# Patient Record
Sex: Female | Born: 1990 | Race: White | Hispanic: No | Marital: Married | Smoking: Never smoker
Health system: Southern US, Community
[De-identification: ages and names within clinical notes are randomized; demographics above are authoritative.]

## PROBLEM LIST (undated history)

## (undated) ENCOUNTER — Inpatient Hospital Stay: Payer: Self-pay

## (undated) DIAGNOSIS — Z789 Other specified health status: Secondary | ICD-10-CM

## (undated) DIAGNOSIS — E119 Type 2 diabetes mellitus without complications: Secondary | ICD-10-CM

## (undated) DIAGNOSIS — O039 Complete or unspecified spontaneous abortion without complication: Secondary | ICD-10-CM

## (undated) HISTORY — PX: NO PAST SURGERIES: SHX2092

## (undated) HISTORY — DX: Complete or unspecified spontaneous abortion without complication: O03.9

---

## 2016-08-01 ENCOUNTER — Ambulatory Visit (INDEPENDENT_AMBULATORY_CARE_PROVIDER_SITE_OTHER): Payer: BLUE CROSS/BLUE SHIELD | Admitting: Obstetrics & Gynecology

## 2016-08-01 ENCOUNTER — Encounter: Payer: Self-pay | Admitting: Obstetrics & Gynecology

## 2016-08-01 VITALS — BP 120/80 | HR 83 | Wt 189.0 lb

## 2016-08-01 DIAGNOSIS — Z3490 Encounter for supervision of normal pregnancy, unspecified, unspecified trimester: Secondary | ICD-10-CM | POA: Insufficient documentation

## 2016-08-01 DIAGNOSIS — Z3A09 9 weeks gestation of pregnancy: Secondary | ICD-10-CM

## 2016-08-01 LAB — POCT URINE PREGNANCY: PREG TEST UR: POSITIVE — AB

## 2016-08-01 NOTE — Addendum Note (Signed)
Addended by: Cornelius MorasPATTERSON, Rehmat Murtagh D on: 08/01/2016 02:30 PM   Modules accepted: Orders

## 2016-08-01 NOTE — Patient Instructions (Signed)
First Trimester of Pregnancy The first trimester of pregnancy is from week 1 until the end of week 13 (months 1 through 3). A week after a sperm fertilizes an egg, the egg will implant on the wall of the uterus. This embryo will begin to develop into a baby. Genes from you and your partner will form the baby. The female genes will determine whether the baby will be a boy or a girl. At 6-8 weeks, the eyes and face will be formed, and the heartbeat can be seen on ultrasound. At the end of 12 weeks, all the baby's organs will be formed. Now that you are pregnant, you will want to do everything you can to have a healthy baby. Two of the most important things are to get good prenatal care and to follow your health care provider's instructions. Prenatal care is all the medical care you receive before the baby's birth. This care will help prevent, find, and treat any problems during the pregnancy and childbirth. Body changes during your first trimester Your body goes through many changes during pregnancy. The changes vary from woman to woman.  You may gain or lose a couple of pounds at first.  You may feel sick to your stomach (nauseous) and you may throw up (vomit). If the vomiting is uncontrollable, call your health care provider.  You may tire easily.  You may develop headaches that can be relieved by medicines. All medicines should be approved by your health care provider.  You may urinate more often. Painful urination may mean you have a bladder infection.  You may develop heartburn as a result of your pregnancy.  You may develop constipation because certain hormones are causing the muscles that push stool through your intestines to slow down.  You may develop hemorrhoids or swollen veins (varicose veins).  Your breasts may begin to grow larger and become tender. Your nipples may stick out more, and the tissue that surrounds them (areola) may become darker.  Your gums may bleed and may be  sensitive to brushing and flossing.  Dark spots or blotches (chloasma, mask of pregnancy) may develop on your face. This will likely fade after the baby is born.  Your menstrual periods will stop.  You may have a loss of appetite.  You may develop cravings for certain kinds of food.  You may have changes in your emotions from day to day, such as being excited to be pregnant or being concerned that something may go wrong with the pregnancy and baby.  You may have more vivid and strange dreams.  You may have changes in your hair. These can include thickening of your hair, rapid growth, and changes in texture. Some women also have hair loss during or after pregnancy, or hair that feels dry or thin. Your hair will most likely return to normal after your baby is born. What to expect at prenatal visits During a routine prenatal visit:  You will be weighed to make sure you and the baby are growing normally.  Your blood pressure will be taken.  Your abdomen will be measured to track your baby's growth.  The fetal heartbeat will be listened to between weeks 10 and 14 of your pregnancy.  Test results from any previous visits will be discussed. Your health care provider may ask you:  How you are feeling.  If you are feeling the baby move.  If you have had any abnormal symptoms, such as leaking fluid, bleeding, severe headaches, or abdominal   cramping.  If you are using any tobacco products, including cigarettes, chewing tobacco, and electronic cigarettes.  If you have any questions. Other tests that may be performed during your first trimester include:  Blood tests to find your blood type and to check for the presence of any previous infections. The tests will also be used to check for low iron levels (anemia) and protein on red blood cells (Rh antibodies). Depending on your risk factors, or if you previously had diabetes during pregnancy, you may have tests to check for high blood sugar  that affects pregnant women (gestational diabetes).  Urine tests to check for infections, diabetes, or protein in the urine.  An ultrasound to confirm the proper growth and development of the baby.  Fetal screens for spinal cord problems (spina bifida) and Down syndrome.  HIV (human immunodeficiency virus) testing. Routine prenatal testing includes screening for HIV, unless you choose not to have this test.  You may need other tests to make sure you and the baby are doing well. Follow these instructions at home: Medicines   Follow your health care provider's instructions regarding medicine use. Specific medicines may be either safe or unsafe to take during pregnancy.  Take a prenatal vitamin that contains at least 600 micrograms (mcg) of folic acid.  If you develop constipation, try taking a stool softener if your health care provider approves. Eating and drinking   Eat a balanced diet that includes fresh fruits and vegetables, whole grains, good sources of protein such as meat, eggs, or tofu, and low-fat dairy. Your health care provider will help you determine the amount of weight gain that is right for you.  Avoid raw meat and uncooked cheese. These carry germs that can cause birth defects in the baby.  Eating four or five small meals rather than three large meals a day may help relieve nausea and vomiting. If you start to feel nauseous, eating a few soda crackers can be helpful. Drinking liquids between meals, instead of during meals, also seems to help ease nausea and vomiting.  Limit foods that are high in fat and processed sugars, such as fried and sweet foods.  To prevent constipation:  Eat foods that are high in fiber, such as fresh fruits and vegetables, whole grains, and beans.  Drink enough fluid to keep your urine clear or pale yellow. Activity   Exercise only as directed by your health care provider. Most women can continue their usual exercise routine during  pregnancy. Try to exercise for 30 minutes at least 5 days a week. Exercising will help you:  Control your weight.  Stay in shape.  Be prepared for labor and delivery.  Experiencing pain or cramping in the lower abdomen or lower back is a good sign that you should stop exercising. Check with your health care provider before continuing with normal exercises.  Try to avoid standing for long periods of time. Move your legs often if you must stand in one place for a long time.  Avoid heavy lifting.  Wear low-heeled shoes and practice good posture.  You may continue to have sex unless your health care provider tells you not to. Relieving pain and discomfort   Wear a good support bra to relieve breast tenderness.  Take warm sitz baths to soothe any pain or discomfort caused by hemorrhoids. Use hemorrhoid cream if your health care provider approves.  Rest with your legs elevated if you have leg cramps or low back pain.  If you develop   varicose veins in your legs, wear support hose. Elevate your feet for 15 minutes, 3-4 times a day. Limit salt in your diet. Prenatal care   Schedule your prenatal visits by the twelfth week of pregnancy. They are usually scheduled monthly at first, then more often in the last 2 months before delivery.  Write down your questions. Take them to your prenatal visits.  Keep all your prenatal visits as told by your health care provider. This is important. Safety   Wear your seat belt at all times when driving.  Make a list of emergency phone numbers, including numbers for family, friends, the hospital, and police and fire departments. General instructions   Ask your health care provider for a referral to a local prenatal education class. Begin classes no later than the beginning of month 6 of your pregnancy.  Ask for help if you have counseling or nutritional needs during pregnancy. Your health care provider can offer advice or refer you to specialists for  help with various needs.  Do not use hot tubs, steam rooms, or saunas.  Do not douche or use tampons or scented sanitary pads.  Do not cross your legs for long periods of time.  Avoid cat litter boxes and soil used by cats. These carry germs that can cause birth defects in the baby and possibly loss of the fetus by miscarriage or stillbirth.  Avoid all smoking, herbs, alcohol, and medicines not prescribed by your health care provider. Chemicals in these products affect the formation and growth of the baby.  Do not use any products that contain nicotine or tobacco, such as cigarettes and e-cigarettes. If you need help quitting, ask your health care provider. You may receive counseling support and other resources to help you quit.  Schedule a dentist appointment. At home, brush your teeth with a soft toothbrush and be gentle when you floss. Contact a health care provider if:  You have dizziness.  You have mild pelvic cramps, pelvic pressure, or nagging pain in the abdominal area.  You have persistent nausea, vomiting, or diarrhea.  You have a bad smelling vaginal discharge.  You have pain when you urinate.  You notice increased swelling in your face, hands, legs, or ankles.  You are exposed to fifth disease or chickenpox.  You are exposed to German measles (rubella) and have never had it. Get help right away if:  You have a fever.  You are leaking fluid from your vagina.  You have spotting or bleeding from your vagina.  You have severe abdominal cramping or pain.  You have rapid weight gain or loss.  You vomit blood or material that looks like coffee grounds.  You develop a severe headache.  You have shortness of breath.  You have any kind of trauma, such as from a fall or a car accident. Summary  The first trimester of pregnancy is from week 1 until the end of week 13 (months 1 through 3).  Your body goes through many changes during pregnancy. The changes vary  from woman to woman.  You will have routine prenatal visits. During those visits, your health care provider will examine you, discuss any test results you may have, and talk with you about how you are feeling. This information is not intended to replace advice given to you by your health care provider. Make sure you discuss any questions you have with your health care provider. Document Released: 04/26/2001 Document Revised: 04/13/2016 Document Reviewed: 04/13/2016 Elsevier Interactive Patient Education    2017 [employer]  Commonly Asked Questions During Pregnancy  Cats: A parasite can be excreted in cat feces.  To avoid exposure you need to have another person empty the little box.  If you must empty the litter box you will need to wear gloves.  Wash your hands after handling your cat.  This parasite can also be found in raw or undercooked meat so this should also be avoided.  Colds, Sore Throats, Flu: Please check your medication sheet to see what you can take for symptoms.  If your symptoms are unrelieved by these medications please call the office.  Dental Work: Most any dental work Agricultural consultantyour dentist recommends is permitted.  X-rays should only be taken during the first trimester if absolutely necessary.  Your abdomen should be shielded with a lead apron during all x-rays.  Please notify your provider prior to receiving any x-rays.  Novocaine is fine; gas is not recommended.  If your dentist requires a note from us prior to dental work please call the office and we will provide one for you.  Exercise: Exercise is an important part of staying healthy during your pregnancy.  You may continue most exercises you were accustomed to prior to pregnancy.  Later in your pregnancy you will most likely notice you have difficulty with activities requiring balance like riding a bicycle.  It is important that you listen to your body and avoid activities that put you at a higher risk of falling.  Adequate rest and  staying well hydrated are a must!  If you have questions about the safety of specific activities ask your provider.    Exposure to Children with illness: Try to avoid obvious exposure; report any symptoms to us when noted,  If you have chicken pos, red measles or mumps, you should be immune to these diseases.   Please do not take any vaccines while pregnant unless you have checked with your OB provider.  Fetal Movement: After 28 weeks we recommend you do "kick counts" twice daily.  Lie or sit down in a calm quiet environment and count your baby movements "kicks".  You should feel your baby at least 10 times per hour.  If you have not felt 10 kicks within the first hour get up, walk around and have something sweet to eat or drink then repeat for an additional hour.  If count remains less than 10 per hour notify your provider.  Fumigating: Follow your pest control agent's advice as to how long to stay out of your home.  Ventilate the area well before re-entering.  Hemorrhoids:   Most over-the-counter preparations can be used during pregnancy.  Check your medication to see what is safe to use.  It is important to use a stool softener or fiber in your diet and to drink lots of liquids.  If hemorrhoids seem to be getting worse please call the office.   Hot Tubs:  Hot tubs Jacuzzis and saunas are not recommended while pregnant.  These increase your internal body temperature and should be avoided.  Intercourse:  Sexual intercourse is safe during pregnancy as long as you are comfortable, unless otherwise advised by your provider.  Spotting may occur after intercourse; report any bright red bleeding that is heavier than spotting.  Labor:  If you know that you are in labor, please go to the hospital.  If you are unsure, please call the office and let us help you decide what to do.  Lifting, straining, etc:  If  requires heavy lifting or straining please check with your provider for any limitations.   Generally, you should not lift items heavier than that you can lift simply with your hands and arms (no back muscles)  Painting:  Paint fumes do not harm your pregnancy, but may make you ill and should be avoided if possible.  Latex or water based paints have less odor than oils.  Use adequate ventilation while painting.  Permanents & Hair Color:  Chemicals in hair dyes are not recommended as they cause increase hair dryness which can increase hair loss during pregnancy.  " Highlighting" and permanents are allowed.  Dye may be absorbed differently and permanents may not hold as well during pregnancy.  Sunbathing:  Use a sunscreen, as skin burns easily during pregnancy.  Drink plenty of fluids; avoid over heating.  Tanning Beds:  Because their possible side effects are still unknown, tanning beds are not recommended.  Ultrasound Scans:  Routine ultrasounds are performed at approximately 20 weeks.  You will be able to see your baby's general anatomy an if you would like to know the gender this can usually be determined as well.  If it is questionable when you conceived you may also receive an ultrasound early in your pregnancy for dating purposes.  Otherwise ultrasound exams are not routinely performed unless there is a medical necessity.  Although you can request a scan we ask that you pay for it when conducted because insurance does not cover " patient request" scans.  Work: If your pregnancy proceeds without complications you may work until your due date, unless your physician or employer advises otherwise.  Round Ligament Pain/Pelvic Discomfort:  Sharp, shooting pains not associated with bleeding are fairly common, usually occurring in the second trimester of pregnancy.  They tend to be worse when standing up or when you remain standing for long periods of time.  These are the result of pressure of certain pelvic ligaments called "round ligaments".  Rest, Tylenol and heat seem to be the most  effective relief.  As the womb and fetus grow, they rise out of the pelvis and the discomfort improves.  Please notify the office if your pain seems different than that described.  It may represent a more serious condition.   

## 2016-08-01 NOTE — Progress Notes (Addendum)
08/01/2016   Chief Complaint: Missed period  History of Present Illness: Ms. Whiteaker is a 26 y.o. G1P0 at 9 weeks based on Patient's last menstrual period was 05/30/2016. with an Estimated Date of Delivery: 03/06/17, with the above CC. Preg complicated by nothing.  Her periods were: regular periods every 28 days She was using no method when she conceived.  She has Positive signs or symptoms of nausea/vomiting of pregnancy. She has Negative signs or symptoms of miscarriage or preterm labor She identifies Negative Zika risk factors for her and her partner On any different medications around the time she conceived/early pregnancy: No  History of varicella: Yes   ROS: A 12-point review of systems was performed and negative, except as stated in the above HPI.  OBGYN History: As per HPI. OB History  Gravida Para Term Preterm AB Living  1            SAB TAB Ectopic Multiple Live Births               # Outcome Date GA Lbr Len/2nd Weight Sex Delivery Anes PTL Lv  1 Current               Any issues with any prior pregnancies: not applicable Any prior children are healthy, doing well, without any problems or issues: not applicable History of pap smears: Yes. Last pap smear one year. Abnormal: no  History of STIs: No   Past Medical History: No past medical history on file.  Past Surgical History: No past surgical history on file.  Family History:  No family history on file. She denies any female cancers, bleeding or blood clotting disorders.  She denies any history of mental retardation, birth defects or genetic disorders in her or the FOB's history  Social History:  Social History   Social History  . Marital status: Married    Spouse name: N/A  . Number of children: N/A  . Years of education: N/A   Occupational History  . Not on file.   Social History Main Topics  . Smoking status: Not on file  . Smokeless tobacco: Not on file  . Alcohol use Not on file  . Drug use:  Unknown  . Sexual activity: Not on file   Other Topics Concern  . Not on file   Social History Narrative  . No narrative on file   Any pets in the household: no  Allergy: Not on File  Current Outpatient Medications: No current outpatient prescriptions on file.   Physical Exam:   BP 120/80   Pulse 83   Wt 189 lb (85.7 kg)   LMP 05/30/2016  BMI 33. Fundal height: not applicable FHTs: too early  Constitutional: Well nourished, well developed female in no acute distress.  Neck:  Supple, normal appearance, and no thyromegaly  Cardiovascular: S1, S2 normal, no murmur, rub or gallop, regular rate and rhythm Respiratory:  Clear to auscultation bilateral. Normal respiratory effort Abdomen: positive bowel sounds and no masses, hernias; diffusely non tender to palpation, non distended Breasts: breasts appear normal, no suspicious masses, no skin or nipple changes or axillary nodes. Neuro/Psych:  Normal mood and affect.  Skin:  Warm and dry.  Lymphatic:  No inguinal lymphadenopathy.   Pelvic exam: is not limited by body habitus EGBUS: within normal limits, Vagina: within normal limits and with no blood in the vault, Cervix: normal appearing cervix without discharge or lesions, closed/long/high, Uterus:  enlarged: 8 weeks, and Adnexa:  not evaluated  Assessment: Ms. Rennis Hardingllis is a 26 y.o. G1P0 9 weeks based on Patient's last menstrual period was 05/30/2016. with an Estimated Date of Delivery: 03/06/17 noted.,  for prenatal care.  Plan:  PNC discussed.  The patient has not traveled to a [place] endemic area within the past 6 months, nor has she had unprotected sex with a partner who has travelled to a [place] endemic region within the past 6 months. The patient has been advised to notify us if these factors change any time during this current pregnancy, so adequate testing and monitoring can be initiated.  Problem list reviewed and updated.  Follow up in 2 weeks. Annamarie MajorPaul Nardos Putnam, MD,  Merlinda FrederickFACOG [facility], [facility] 08/01/2016  1:44 PM

## 2016-08-03 LAB — RPR+RH+ABO+RUB AB+AB SCR+CB...
ANTIBODY SCREEN: NEGATIVE
HIV Screen 4th Generation wRfx: NONREACTIVE
Hematocrit: 40.5 % (ref 34.0–46.6)
Hemoglobin: 13.6 g/dL (ref 11.1–15.9)
Hepatitis B Surface Ag: NEGATIVE
MCH: 31.3 pg (ref 26.6–33.0)
MCHC: 33.6 g/dL (ref 31.5–35.7)
MCV: 93 fL (ref 79–97)
PLATELETS: 368 10*3/uL (ref 150–379)
RBC: 4.35 x10E6/uL (ref 3.77–5.28)
RDW: 12.9 % (ref 12.3–15.4)
RPR Ser Ql: NONREACTIVE
Rh Factor: POSITIVE
Rubella Antibodies, IGG: 5.08 index (ref >0.99)
VARICELLA: 241 {index} (ref >165)
WBC: 14.3 10*3/uL — AB (ref 3.4–10.8)

## 2016-08-03 LAB — URINE CULTURE: Organism ID, Bacteria: NO GROWTH

## 2016-08-04 LAB — IGP,CTNGTV,RFX APTIMA HPV ASCU
Chlamydia, Nuc. Acid Amp: NEGATIVE
Gonococcus, Nuc. Acid Amp: NEGATIVE
PAP Smear Comment: 0
Trich vag by NAA: NEGATIVE

## 2016-08-25 ENCOUNTER — Ambulatory Visit (INDEPENDENT_AMBULATORY_CARE_PROVIDER_SITE_OTHER): Payer: BLUE CROSS/BLUE SHIELD | Admitting: Obstetrics and Gynecology

## 2016-08-25 ENCOUNTER — Ambulatory Visit (INDEPENDENT_AMBULATORY_CARE_PROVIDER_SITE_OTHER): Payer: BLUE CROSS/BLUE SHIELD

## 2016-08-25 VITALS — BP 120/80 | Wt 189.0 lb

## 2016-08-25 DIAGNOSIS — Z3A09 9 weeks gestation of pregnancy: Secondary | ICD-10-CM | POA: Diagnosis not present

## 2016-08-25 DIAGNOSIS — Z3687 Encounter for antenatal screening for uncertain dates: Secondary | ICD-10-CM

## 2016-08-25 DIAGNOSIS — Z3490 Encounter for supervision of normal pregnancy, unspecified, unspecified trimester: Secondary | ICD-10-CM | POA: Diagnosis not present

## 2016-08-25 DIAGNOSIS — Z3A12 12 weeks gestation of pregnancy: Secondary | ICD-10-CM

## 2016-08-25 NOTE — Progress Notes (Signed)
Dating scan confirms EDD. Pt declines 1st tri scrn. Improved nausea. No VB, LOF. Pos PNVs. RTO in 4 wks.

## 2016-08-30 NOTE — Progress Notes (Unsigned)
Review of ULTRASOUND.    I have personally reviewed images and report of recent ultrasound done at [facility].    Plan of management to be discussed with patient.  Annamarie Major, MD, [facility], [facility] 08/30/2016  10:18 AM

## 2016-09-23 ENCOUNTER — Ambulatory Visit (INDEPENDENT_AMBULATORY_CARE_PROVIDER_SITE_OTHER): Payer: BLUE CROSS/BLUE SHIELD | Admitting: Certified Nurse Midwife

## 2016-09-23 VITALS — BP 120/80 | Ht 63.0 in | Wt 193.0 lb

## 2016-09-23 DIAGNOSIS — Z131 Encounter for screening for diabetes mellitus: Secondary | ICD-10-CM

## 2016-09-23 DIAGNOSIS — Z3A16 16 weeks gestation of pregnancy: Secondary | ICD-10-CM

## 2016-09-23 DIAGNOSIS — Z3490 Encounter for supervision of normal pregnancy, unspecified, unspecified trimester: Secondary | ICD-10-CM

## 2016-09-23 DIAGNOSIS — O99210 Obesity complicating pregnancy, unspecified trimester: Secondary | ICD-10-CM

## 2016-09-23 NOTE — Progress Notes (Signed)
Doing well. Possibly feeling flutters FH at 1/2 between U and SP, FHTs WNL Declines genetic screening NOB labs WNL. Needs 1 hr GTT scheduled ASAP for BMI>30 Anatomy scan in 3 weeks

## 2016-09-23 NOTE — Progress Notes (Signed)
Pt reports no problems.   

## 2016-09-30 ENCOUNTER — Other Ambulatory Visit: Payer: BLUE CROSS/BLUE SHIELD

## 2016-09-30 DIAGNOSIS — Z131 Encounter for screening for diabetes mellitus: Secondary | ICD-10-CM

## 2016-09-30 DIAGNOSIS — O99210 Obesity complicating pregnancy, unspecified trimester: Secondary | ICD-10-CM

## 2016-10-01 LAB — GLUCOSE, 1 HOUR GESTATIONAL: Gestational Diabetes Screen: 135 mg/dL (ref 65–139)

## 2016-10-14 ENCOUNTER — Ambulatory Visit (INDEPENDENT_AMBULATORY_CARE_PROVIDER_SITE_OTHER): Payer: BLUE CROSS/BLUE SHIELD

## 2016-10-14 ENCOUNTER — Ambulatory Visit (INDEPENDENT_AMBULATORY_CARE_PROVIDER_SITE_OTHER): Payer: BLUE CROSS/BLUE SHIELD | Admitting: Obstetrics and Gynecology

## 2016-10-14 VITALS — BP 126/74 | Wt 194.0 lb

## 2016-10-14 DIAGNOSIS — Z048 Encounter for examination and observation for other specified reasons: Secondary | ICD-10-CM

## 2016-10-14 DIAGNOSIS — Z3490 Encounter for supervision of normal pregnancy, unspecified, unspecified trimester: Secondary | ICD-10-CM

## 2016-10-14 DIAGNOSIS — Z0489 Encounter for examination and observation for other specified reasons: Secondary | ICD-10-CM

## 2016-10-14 DIAGNOSIS — IMO0002 Reserved for concepts with insufficient information to code with codable children: Secondary | ICD-10-CM

## 2016-10-14 DIAGNOSIS — Z362 Encounter for other antenatal screening follow-up: Secondary | ICD-10-CM | POA: Diagnosis not present

## 2016-10-14 DIAGNOSIS — Z3A20 20 weeks gestation of pregnancy: Secondary | ICD-10-CM

## 2016-10-14 NOTE — Progress Notes (Signed)
Sciatica nerve pain/Anatomy scan today

## 2016-10-14 NOTE — Progress Notes (Signed)
Anatomy scan incomplete follow up in 4 week heart views and diaphragm

## 2016-11-07 ENCOUNTER — Other Ambulatory Visit: Payer: BLUE CROSS/BLUE SHIELD

## 2016-11-07 ENCOUNTER — Encounter: Payer: BLUE CROSS/BLUE SHIELD | Admitting: Obstetrics and Gynecology

## 2016-11-08 ENCOUNTER — Ambulatory Visit (INDEPENDENT_AMBULATORY_CARE_PROVIDER_SITE_OTHER): Payer: BLUE CROSS/BLUE SHIELD | Admitting: Obstetrics & Gynecology

## 2016-11-08 ENCOUNTER — Ambulatory Visit (INDEPENDENT_AMBULATORY_CARE_PROVIDER_SITE_OTHER): Payer: BLUE CROSS/BLUE SHIELD

## 2016-11-08 VITALS — BP 130/80 | Wt 199.0 lb

## 2016-11-08 DIAGNOSIS — Z3A20 20 weeks gestation of pregnancy: Secondary | ICD-10-CM

## 2016-11-08 DIAGNOSIS — Z3490 Encounter for supervision of normal pregnancy, unspecified, unspecified trimester: Secondary | ICD-10-CM

## 2016-11-08 DIAGNOSIS — Z3A23 23 weeks gestation of pregnancy: Secondary | ICD-10-CM

## 2016-11-08 DIAGNOSIS — Z0489 Encounter for examination and observation for other specified reasons: Secondary | ICD-10-CM

## 2016-11-08 DIAGNOSIS — IMO0002 Reserved for concepts with insufficient information to code with codable children: Secondary | ICD-10-CM

## 2016-11-08 DIAGNOSIS — Z048 Encounter for examination and observation for other specified reasons: Secondary | ICD-10-CM

## 2016-11-08 NOTE — Progress Notes (Signed)
US discussed. PNV. Breast feeding plans.  Pill then nuvaring. Glucola at 29 weeks.

## 2016-11-11 ENCOUNTER — Other Ambulatory Visit: Payer: BLUE CROSS/BLUE SHIELD

## 2016-11-11 ENCOUNTER — Encounter: Payer: BLUE CROSS/BLUE SHIELD | Admitting: Obstetrics and Gynecology

## 2016-12-05 ENCOUNTER — Ambulatory Visit (INDEPENDENT_AMBULATORY_CARE_PROVIDER_SITE_OTHER): Payer: BLUE CROSS/BLUE SHIELD | Admitting: Obstetrics and Gynecology

## 2016-12-05 VITALS — BP 126/84 | Wt 201.0 lb

## 2016-12-05 DIAGNOSIS — Z3A27 27 weeks gestation of pregnancy: Secondary | ICD-10-CM

## 2016-12-05 DIAGNOSIS — Z3490 Encounter for supervision of normal pregnancy, unspecified, unspecified trimester: Secondary | ICD-10-CM

## 2016-12-05 DIAGNOSIS — Z113 Encounter for screening for infections with a predominantly sexual mode of transmission: Secondary | ICD-10-CM

## 2016-12-05 DIAGNOSIS — Z131 Encounter for screening for diabetes mellitus: Secondary | ICD-10-CM

## 2016-12-05 NOTE — Progress Notes (Signed)
Prenatal Visit Note Date: 12/05/2016 Clinic: [facility]  Subjective:  Rebecca Gomez is a 26 y.o. G1P0 at 6991w0d being seen today for ongoing prenatal care.  She is currently monitored for the following issues for this low-risk pregnancy and has Encounter for supervision of low-risk pregnancy, antepartum on her problem list.   Patient reports no bleeding, no contractions and no leaking.   Contractions: Not present. Vag. Bleeding: None.  Movement: Present. Denies leaking of fluid.   The following portions of the patient's history were reviewed and updated as appropriate: allergies, current medications, past family history, past medical history, past social history, past surgical history and problem list. Problem list updated.  Objective:   Vitals:   12/05/16 1125  BP: 126/84  Weight: 201 lb (91.2 kg)  12 lb (5.443 kg) Total weight gain  Fetal Status: Fetal Heart Rate (bpm): 155 Fundal Height: 26 cm Movement: Present     General:  Alert, oriented and cooperative. Patient is in no acute distress.  Skin: Skin is warm and dry. No rash noted.   Cardiovascular: Normal heart rate noted  Respiratory: Normal respiratory effort, no problems with respiration noted  Abdomen: Soft, gravid, appropriate for gestational age. Pain/Pressure: Absent     Pelvic:  Cervical exam deferred        Extremities: Normal range of motion.     Mental Status: Normal mood and affect. Normal behavior. Normal judgment and thought content.   Urinalysis:      Assessment and Plan:  Pregnancy: G1P0 at 4091w0d  1. Encounter for supervision of low-risk pregnancy, antepartum - 28 Week RH+Panel; Future (next visit) 2. [redacted] weeks gestation of pregnancy 3. Screening for diabetes mellitus - 28 Week RH+Panel; Future  4. Screen for STD (sexually transmitted disease) - 28 Week RH+Panel; Future  Preterm labor symptoms and general obstetric precautions including but not limited to vaginal bleeding, contractions, leaking of  fluid and fetal movement were reviewed in detail with the patient. Please refer to After Visit Summary for other counseling recommendations.   Return in about 2 weeks (around 12/19/2016) for Routine Prenatal Appointment.   Thomasene MohairStephen Kyros Salzwedel, MD 12/05/2016 12:05 PM

## 2016-12-20 ENCOUNTER — Other Ambulatory Visit: Payer: BLUE CROSS/BLUE SHIELD

## 2016-12-20 ENCOUNTER — Ambulatory Visit (INDEPENDENT_AMBULATORY_CARE_PROVIDER_SITE_OTHER): Payer: BLUE CROSS/BLUE SHIELD | Admitting: Obstetrics and Gynecology

## 2016-12-20 VITALS — BP 124/84 | Wt 204.0 lb

## 2016-12-20 DIAGNOSIS — Z113 Encounter for screening for infections with a predominantly sexual mode of transmission: Secondary | ICD-10-CM

## 2016-12-20 DIAGNOSIS — O99810 Abnormal glucose complicating pregnancy: Secondary | ICD-10-CM

## 2016-12-20 DIAGNOSIS — Z3490 Encounter for supervision of normal pregnancy, unspecified, unspecified trimester: Secondary | ICD-10-CM

## 2016-12-20 DIAGNOSIS — Z131 Encounter for screening for diabetes mellitus: Secondary | ICD-10-CM

## 2016-12-20 DIAGNOSIS — Z3A29 29 weeks gestation of pregnancy: Secondary | ICD-10-CM

## 2016-12-20 NOTE — Progress Notes (Signed)
Prenatal Visit Note Date: 12/20/2016 Clinic: [facility]  Subjective:  Rebecca Gomez is a 26 y.o. G1P0 at 6862w1d being seen today for ongoing prenatal care.  She is currently monitored for the following issues for this high-risk pregnancy and has Encounter for supervision of low-risk pregnancy, antepartum on her problem list.   ----------------------------------------------------------------------------------- Patient reports no bleeding, no contractions and no leaking.   Contractions: Not present. Vag. Bleeding: None.  Movement: Present. Denies leaking of fluid.  28 week labs today ----------------------------------------------------------------------------------- The following portions of the patient's history were reviewed and updated as appropriate: allergies, current medications, past family history, past medical history, past social history, past surgical history and problem list. Problem list updated.  Objective:   Vitals:   12/20/16 0830  BP: 124/84  Weight: 204 lb (92.5 kg)   Total weight gain for pregnancy: 15 lb (6.804 kg)   Fetal Status: Fetal Heart Rate (bpm): 135 Fundal Height: 29 cm Movement: Present     General:  Alert, oriented and cooperative. Patient is in no acute distress.  Skin: Skin is warm and dry. No rash noted.   Cardiovascular: Normal heart rate noted  Respiratory: Normal respiratory effort, no problems with respiration noted  Abdomen: Soft, gravid, appropriate for gestational age. Pain/Pressure: Absent     Pelvic:  Cervical exam deferred        Extremities: Normal range of motion.     Mental Status: Normal mood and affect. Normal behavior. Normal judgment and thought content.   Urinalysis: Urine Protein: Negative Urine Glucose: Negative  Assessment and Plan:  Pregnancy: G1P0 at 3562w1d  1. Encounter for supervision of low-risk pregnancy, antepartum -TDaP next visit 2. [redacted] weeks gestation of pregnancy  Preterm labor symptoms and general obstetric  precautions including but not limited to vaginal bleeding, contractions, leaking of fluid and fetal movement were reviewed in detail with the patient. Please refer to After Visit Summary for other counseling recommendations.   Return in about 2 weeks (around 01/03/2017) for Routine Prenatal Appointment.  Thomasene MohairStephen Antaniya Venuti, MD 12/20/2016 8:44 AM

## 2016-12-21 LAB — 28 WEEK RH+PANEL
BASOS: 0 %
Basophils Absolute: 0 10*3/uL (ref 0.0–0.2)
EOS (ABSOLUTE): 0.1 10*3/uL (ref 0.0–0.4)
EOS: 0 %
GESTATIONAL DIABETES SCREEN: 142 mg/dL — AB (ref 65–139)
HIV Screen 4th Generation wRfx: NONREACTIVE
Hematocrit: 35.2 % (ref 34.0–46.6)
Hemoglobin: 11.5 g/dL (ref 11.1–15.9)
IMMATURE GRANS (ABS): 0 10*3/uL (ref 0.0–0.1)
IMMATURE GRANULOCYTES: 0 %
Lymphocytes Absolute: 1.9 10*3/uL (ref 0.7–3.1)
Lymphs: 17 %
MCH: 30.6 pg (ref 26.6–33.0)
MCHC: 32.7 g/dL (ref 31.5–35.7)
MCV: 94 fL (ref 79–97)
Monocytes Absolute: 0.6 10*3/uL (ref 0.1–0.9)
Monocytes: 5 %
Neutrophils Absolute: 8.6 10*3/uL — ABNORMAL HIGH (ref 1.4–7.0)
Neutrophils: 78 %
PLATELETS: 320 10*3/uL (ref 150–379)
RBC: 3.76 x10E6/uL — AB (ref 3.77–5.28)
RDW: 13.1 % (ref 12.3–15.4)
RPR Ser Ql: NONREACTIVE
WBC: 11.2 10*3/uL — AB (ref 3.4–10.8)

## 2016-12-22 ENCOUNTER — Encounter: Payer: Self-pay | Admitting: Obstetrics and Gynecology

## 2016-12-22 DIAGNOSIS — O99810 Abnormal glucose complicating pregnancy: Secondary | ICD-10-CM | POA: Insufficient documentation

## 2016-12-29 ENCOUNTER — Other Ambulatory Visit: Payer: BLUE CROSS/BLUE SHIELD

## 2016-12-29 DIAGNOSIS — O99810 Abnormal glucose complicating pregnancy: Secondary | ICD-10-CM

## 2016-12-29 DIAGNOSIS — Z3490 Encounter for supervision of normal pregnancy, unspecified, unspecified trimester: Secondary | ICD-10-CM

## 2016-12-30 LAB — GESTATIONAL GLUCOSE TOLERANCE
GLUCOSE 1 HOUR GTT: 161 mg/dL (ref 65–179)
GLUCOSE 2 HOUR GTT: 146 mg/dL (ref 65–154)
Glucose, Fasting: 72 mg/dL (ref 65–94)
Glucose, GTT - 3 Hour: 113 mg/dL (ref 65–139)

## 2017-01-02 ENCOUNTER — Ambulatory Visit (INDEPENDENT_AMBULATORY_CARE_PROVIDER_SITE_OTHER): Payer: BLUE CROSS/BLUE SHIELD | Admitting: Obstetrics & Gynecology

## 2017-01-02 VITALS — BP 128/80 | Wt 208.0 lb

## 2017-01-02 DIAGNOSIS — Z3490 Encounter for supervision of normal pregnancy, unspecified, unspecified trimester: Secondary | ICD-10-CM

## 2017-01-02 DIAGNOSIS — Z3A31 31 weeks gestation of pregnancy: Secondary | ICD-10-CM | POA: Diagnosis not present

## 2017-01-02 MED ORDER — NYSTATIN 100000 UNIT/ML MT SUSP: 5.0000 mL | Freq: Four times a day (QID) | OROMUCOSAL | 0 refills | Status: DC

## 2017-01-02 MED ORDER — ONDANSETRON 8 MG PO TBDP: 8.0000 mg | ORAL_TABLET | Freq: Three times a day (TID) | ORAL | 1 refills | Status: DC | PRN

## 2017-01-02 MED ORDER — TETANUS-DIPHTH-ACELL PERTUSSIS 5-2.5-18.5 LF-MCG/0.5 IM SUSP
0.5000 mL | Freq: Once | INTRAMUSCULAR | Status: AC
  Administered 2017-01-02: 0.5 mL via INTRAMUSCULAR

## 2017-01-02 NOTE — Addendum Note (Signed)
Addended by: Cornelius Moras D on: 01/02/2017 03:31 PM   Modules accepted: Orders

## 2017-01-02 NOTE — Patient Instructions (Signed)
Third Trimester of Pregnancy The third trimester is from week 28 through week 40 (months 7 through 9). The third trimester is a time when the unborn baby (fetus) is growing rapidly. At the end of the ninth month, the fetus is about 20 inches in length and weighs 6-10 pounds. Body changes during your third trimester Your body will continue to go through many changes during pregnancy. The changes vary from woman to woman. During the third trimester:  Your weight will continue to increase. You can expect to gain 25-35 pounds (11-16 kg) by the end of the pregnancy.  You may begin to get stretch marks on your hips, abdomen, and breasts.  You may urinate more often because the fetus is moving lower into your pelvis and pressing on your bladder.  You may develop or continue to have heartburn. This is caused by increased hormones that slow down muscles in the digestive tract.  You may develop or continue to have constipation because increased hormones slow digestion and cause the muscles that push waste through your intestines to relax.  You may develop hemorrhoids. These are swollen veins (varicose veins) in the rectum that can itch or be painful.  You may develop swollen, bulging veins (varicose veins) in your legs.  You may have increased body aches in the pelvis, back, or thighs. This is due to weight gain and increased hormones that are relaxing your joints.  You may have changes in your hair. These can include thickening of your hair, rapid growth, and changes in texture. Some women also have hair loss during or after pregnancy, or hair that feels dry or thin. Your hair will most likely return to normal after your baby is born.  Your breasts will continue to grow and they will continue to become tender. A yellow fluid (colostrum) may leak from your breasts. This is the first milk you are producing for your baby.  Your belly button may stick out.  You may notice more swelling in your hands,  face, or ankles.  You may have increased tingling or numbness in your hands, arms, and legs. The skin on your belly may also feel numb.  You may feel short of breath because of your expanding uterus.  You may have more problems sleeping. This can be caused by the size of your belly, increased need to urinate, and an increase in your body's metabolism.  You may notice the fetus "dropping," or moving lower in your abdomen (lightening).  You may have increased vaginal discharge.  You may notice your joints feel loose and you may have pain around your pelvic bone.  What to expect at prenatal visits You will have prenatal exams every 2 weeks until week 36. Then you will have weekly prenatal exams. During a routine prenatal visit:  You will be weighed to make sure you and the baby are growing normally.  Your blood pressure will be taken.  Your abdomen will be measured to track your baby's growth.  The fetal heartbeat will be listened to.  Any test results from the previous visit will be discussed.  You may have a cervical check near your due date to see if your cervix has softened or thinned (effaced).  You will be tested for Group B streptococcus. This happens between 35 and 37 weeks.  Your health care provider may ask you:  What your birth plan is.  How you are feeling.  If you are feeling the baby move.  If you have had   any abnormal symptoms, such as leaking fluid, bleeding, severe headaches, or abdominal cramping.  If you are using any tobacco products, including cigarettes, chewing tobacco, and electronic cigarettes.  If you have any questions.  Other tests or screenings that may be performed during your third trimester include:  Blood tests that check for low iron levels (anemia).  Fetal testing to check the health, activity level, and growth of the fetus. Testing is done if you have certain medical conditions or if there are problems during the  pregnancy.  Nonstress test (NST). This test checks the health of your baby to make sure there are no signs of problems, such as the baby not getting enough oxygen. During this test, a belt is placed around your belly. The baby is made to move, and its heart rate is monitored during movement.  What is false labor? False labor is a condition in which you feel small, irregular tightenings of the muscles in the womb (contractions) that usually go away with rest, changing position, or drinking water. These are called Braxton Hicks contractions. Contractions may last for hours, days, or even weeks before true labor sets in. If contractions come at regular intervals, become more frequent, increase in intensity, or become painful, you should see your health care provider. What are the signs of labor?  Abdominal cramps.  Regular contractions that start at 10 minutes apart and become stronger and more frequent with time.  Contractions that start on the top of the uterus and spread down to the lower abdomen and back.  Increased pelvic pressure and dull back pain.  A watery or bloody mucus discharge that comes from the vagina.  Leaking of amniotic fluid. This is also known as your "water breaking." It could be a slow trickle or a gush. Let your health care provider know if it has a color or strange odor. If you have any of these signs, call your health care provider right away, even if it is before your due date. Follow these instructions at home: Medicines  Follow your health care provider's instructions regarding medicine use. Specific medicines may be either safe or unsafe to take during pregnancy.  Take a prenatal vitamin that contains at least 600 micrograms (mcg) of folic acid.  If you develop constipation, try taking a stool softener if your health care provider approves. Eating and drinking  Eat a balanced diet that includes fresh fruits and vegetables, whole grains, good sources of protein  such as meat, eggs, or tofu, and low-fat dairy. Your health care provider will help you determine the amount of weight gain that is right for you.  Avoid raw meat and uncooked cheese. These carry germs that can cause birth defects in the baby.  If you have low calcium intake from food, talk to your health care provider about whether you should take a daily calcium supplement.  Eat four or five small meals rather than three large meals a day.  Limit foods that are high in fat and processed sugars, such as fried and sweet foods.  To prevent constipation: ? Drink enough fluid to keep your urine clear or pale yellow. ? Eat foods that are high in fiber, such as fresh fruits and vegetables, whole grains, and beans. Activity  Exercise only as directed by your health care provider. Most women can continue their usual exercise routine during pregnancy. Try to exercise for 30 minutes at least 5 days a week. Stop exercising if you experience uterine contractions.  Avoid heavy   lifting.  Do not exercise in extreme heat or humidity, or at high altitudes.  Wear low-heel, comfortable shoes.  Practice good posture.  You may continue to have sex unless your health care provider tells you otherwise. Relieving pain and discomfort  Take frequent breaks and rest with your legs elevated if you have leg cramps or low back pain.  Take warm sitz baths to soothe any pain or discomfort caused by hemorrhoids. Use hemorrhoid cream if your health care provider approves.  Wear a good support bra to prevent discomfort from breast tenderness.  If you develop varicose veins: ? Wear support pantyhose or compression stockings as told by your healthcare provider. ? Elevate your feet for 15 minutes, 3-4 times a day. Prenatal care  Write down your questions. Take them to your prenatal visits.  Keep all your prenatal visits as told by your health care provider. This is important. Safety  Wear your seat belt at  all times when driving.  Make a list of emergency phone numbers, including numbers for family, friends, the hospital, and police and fire departments. General instructions  Avoid cat litter boxes and soil used by cats. These carry germs that can cause birth defects in the baby. If you have a cat, ask someone to clean the litter box for you.  Do not travel far distances unless it is absolutely necessary and only with the approval of your health care provider.  Do not use hot tubs, steam rooms, or saunas.  Do not drink alcohol.  Do not use any products that contain nicotine or tobacco, such as cigarettes and e-cigarettes. If you need help quitting, ask your health care provider.  Do not use any medicinal herbs or unprescribed drugs. These chemicals affect the formation and growth of the baby.  Do not douche or use tampons or scented sanitary pads.  Do not cross your legs for long periods of time.  To prepare for the arrival of your baby: ? Take prenatal classes to understand, practice, and ask questions about labor and delivery. ? Make a trial run to the hospital. ? Visit the hospital and tour the maternity area. ? Arrange for maternity or paternity leave through employers. ? Arrange for family and friends to take care of pets while you are in the hospital. ? Purchase a rear-facing car seat and make sure you know how to install it in your car. ? Pack your hospital bag. ? Prepare the baby's nursery. Make sure to remove all pillows and stuffed animals from the baby's crib to prevent suffocation.  Visit your dentist if you have not gone during your pregnancy. Use a soft toothbrush to brush your teeth and be gentle when you floss. Contact a health care provider if:  You are unsure if you are in labor or if your water has broken.  You become dizzy.  You have mild pelvic cramps, pelvic pressure, or nagging pain in your abdominal area.  You have lower back pain.  You have persistent  nausea, vomiting, or diarrhea.  You have an unusual or bad smelling vaginal discharge.  You have pain when you urinate. Get help right away if:  Your water breaks before 37 weeks.  You have regular contractions less than 5 minutes apart before 37 weeks.  You have a fever.  You are leaking fluid from your vagina.  You have spotting or bleeding from your vagina.  You have severe abdominal pain or cramping.  You have rapid weight loss or weight gain.    You have shortness of breath with chest pain.  You notice sudden or extreme swelling of your face, hands, ankles, feet, or legs.  Your baby makes fewer than 10 movements in 2 hours.  You have severe headaches that do not go away when you take medicine.  You have vision changes. Summary  The third trimester is from week 28 through week 40, months 7 through 9. The third trimester is a time when the unborn baby (fetus) is growing rapidly.  During the third trimester, your discomfort may increase as you and your baby continue to gain weight. You may have abdominal, leg, and back pain, sleeping problems, and an increased need to urinate.  During the third trimester your breasts will keep growing and they will continue to become tender. A yellow fluid (colostrum) may leak from your breasts. This is the first milk you are producing for your baby.  False labor is a condition in which you feel small, irregular tightenings of the muscles in the womb (contractions) that eventually go away. These are called Braxton Hicks contractions. Contractions may last for hours, days, or even weeks before true labor sets in.  Signs of labor can include: abdominal cramps; regular contractions that start at 10 minutes apart and become stronger and more frequent with time; watery or bloody mucus discharge that comes from the vagina; increased pelvic pressure and dull back pain; and leaking of amniotic fluid. This information is not intended to replace advice  given to you by your health care provider. Make sure you discuss any questions you have with your health care provider. Document Released: 04/26/2001 Document Revised: 10/08/2015 Document Reviewed: 07/03/2012 Elsevier Interactive Patient Education  2017 [employer]  

## 2017-01-02 NOTE — Progress Notes (Signed)
Nystatin swish and spit for oral thrush on exam and sx's today PNV, FMC, PTL precautions. TDaP.  Blood transfusion consent.

## 2017-01-02 NOTE — Addendum Note (Signed)
Addended by: Nadara Mustard on: 01/02/2017 03:46 PM   Modules accepted: Orders

## 2017-01-19 ENCOUNTER — Ambulatory Visit (INDEPENDENT_AMBULATORY_CARE_PROVIDER_SITE_OTHER): Payer: BLUE CROSS/BLUE SHIELD | Admitting: Obstetrics and Gynecology

## 2017-01-19 VITALS — BP 128/84 | Wt 215.0 lb

## 2017-01-19 DIAGNOSIS — Z3A33 33 weeks gestation of pregnancy: Secondary | ICD-10-CM

## 2017-01-19 DIAGNOSIS — Z3490 Encounter for supervision of normal pregnancy, unspecified, unspecified trimester: Secondary | ICD-10-CM

## 2017-01-19 DIAGNOSIS — O99810 Abnormal glucose complicating pregnancy: Secondary | ICD-10-CM

## 2017-01-19 NOTE — Progress Notes (Signed)
  Routine Prenatal Care Visit  Subjective  Rebecca Gomez is a 26 y.o. G1P0 at 3520w3d being seen today for ongoing prenatal care.  She is currently monitored for the following issues for this low-risk pregnancy and has Encounter for supervision of low-risk pregnancy, antepartum and Abnormal glucose tolerance affecting pregnancy, antepartum on her problem list.  ----------------------------------------------------------------------------------- Patient reports no complaints.   Contractions: Not present. Vag. Bleeding: None.  Movement: Present. Denies leaking of fluid.  ----------------------------------------------------------------------------------- The following portions of the patient's history were reviewed and updated as appropriate: allergies, current medications, past family history, past medical history, past social history, past surgical history and problem list. Problem list updated.  Objective  Blood pressure 128/84, weight 215 lb (97.5 kg), last menstrual period 05/30/2016. Pregravid weight 189 lb (85.7 kg) Total Weight Gain 26 lb (11.8 kg) Urinalysis:      Fetal Status: Fetal Heart Rate (bpm): 135 Fundal Height: 33 cm Movement: Present     General:  Alert, oriented and cooperative. Patient is in no acute distress.  Skin: Skin is warm and dry. No rash noted.   Cardiovascular: Normal heart rate noted  Respiratory: Normal respiratory effort, no problems with respiration noted  Abdomen: Soft, gravid, appropriate for gestational age. Pain/Pressure: Absent     Pelvic:  Cervical exam deferred        Extremities: Normal range of motion.     Mental Status: Normal mood and affect. Normal behavior. Normal judgment and thought content.   Assessment   26 y.o. G1P0 at 1220w3d by  03/06/2017, by Last Menstrual Period presenting for routine prenatal visit  Plan   pregnancy 1 Problems (from 05/30/16 to present)    No problems associated with this episode.     Preterm labor symptoms and  general obstetric precautions including but not limited to vaginal bleeding, contractions, leaking of fluid and fetal movement were reviewed in detail with the patient. Please refer to After Visit Summary for other counseling recommendations.   Return in about 2 weeks (around 02/02/2017) for Routine Prenatal Appointment.  Thomasene MohairStephen Coltrane Tugwell, MD  01/19/2017 5:31 PM

## 2017-02-03 ENCOUNTER — Ambulatory Visit (INDEPENDENT_AMBULATORY_CARE_PROVIDER_SITE_OTHER): Payer: BLUE CROSS/BLUE SHIELD | Admitting: Maternal Newborn

## 2017-02-03 ENCOUNTER — Encounter: Payer: Self-pay | Admitting: *Deleted

## 2017-02-03 ENCOUNTER — Observation Stay
Admission: EM | Admit: 2017-02-03 | Discharge: 2017-02-03 | Disposition: A | Discharge status: Home / Self Care | Payer: BLUE CROSS/BLUE SHIELD | Attending: Obstetrics & Gynecology | Admitting: Obstetrics & Gynecology

## 2017-02-03 VITALS — BP 148/82 | Wt 216.0 lb

## 2017-02-03 DIAGNOSIS — R51 Headache: Secondary | ICD-10-CM | POA: Insufficient documentation

## 2017-02-03 DIAGNOSIS — O169 Unspecified maternal hypertension, unspecified trimester: Secondary | ICD-10-CM | POA: Diagnosis present

## 2017-02-03 DIAGNOSIS — Z79899 Other long term (current) drug therapy: Secondary | ICD-10-CM | POA: Diagnosis not present

## 2017-02-03 DIAGNOSIS — Z6835 Body mass index (BMI) 35.0-35.9, adult: Secondary | ICD-10-CM | POA: Diagnosis not present

## 2017-02-03 DIAGNOSIS — O163 Unspecified maternal hypertension, third trimester: Principal | ICD-10-CM | POA: Insufficient documentation

## 2017-02-03 DIAGNOSIS — Z3490 Encounter for supervision of normal pregnancy, unspecified, unspecified trimester: Secondary | ICD-10-CM

## 2017-02-03 DIAGNOSIS — Z3A35 35 weeks gestation of pregnancy: Secondary | ICD-10-CM | POA: Diagnosis not present

## 2017-02-03 HISTORY — DX: Other specified health status: Z78.9

## 2017-02-03 LAB — COMPREHENSIVE METABOLIC PANEL
ALBUMIN: 2.8 g/dL — AB (ref 3.5–5.0)
ALK PHOS: 148 U/L — AB (ref 38–126)
ALT: 12 U/L — ABNORMAL LOW (ref 14–54)
ANION GAP: 8 (ref 5–15)
AST: 19 U/L (ref 15–41)
BUN: 9 mg/dL (ref 6–20)
CHLORIDE: 104 mmol/L (ref 101–111)
CO2: 23 mmol/L (ref 22–32)
Calcium: 8.6 mg/dL — ABNORMAL LOW (ref 8.9–10.3)
Creatinine, Ser: 0.58 mg/dL (ref 0.44–1.00)
GFR calc non Af Amer: >60 mL/min (ref >60)
GLUCOSE: 83 mg/dL (ref 65–99)
Potassium: 4.1 mmol/L (ref 3.5–5.1)
SODIUM: 135 mmol/L (ref 135–145)
Total Bilirubin: 0.2 mg/dL — ABNORMAL LOW (ref 0.3–1.2)
Total Protein: 6.7 g/dL (ref 6.5–8.1)

## 2017-02-03 LAB — CBC
HCT: 33.3 % — ABNORMAL LOW (ref 35.0–47.0)
HEMOGLOBIN: 11.3 g/dL — AB (ref 12.0–16.0)
MCH: 28.7 pg (ref 26.0–34.0)
MCHC: 33.8 g/dL (ref 32.0–36.0)
MCV: 85 fL (ref 80.0–100.0)
PLATELETS: 327 10*3/uL (ref 150–440)
RBC: 3.92 MIL/uL (ref 3.80–5.20)
RDW: 14.1 % (ref 11.5–14.5)
WBC: 12.6 10*3/uL — ABNORMAL HIGH (ref 3.6–11.0)

## 2017-02-03 LAB — PROTEIN / CREATININE RATIO, URINE: CREATININE, URINE: 22 mg/dL

## 2017-02-03 MED ORDER — ONDANSETRON HCL 4 MG/2ML IJ SOLN: 4.0000 mg | Freq: Four times a day (QID) | INTRAMUSCULAR | Status: DC | PRN

## 2017-02-03 MED ORDER — ACETAMINOPHEN 325 MG PO TABS: 650.0000 mg | ORAL_TABLET | ORAL | Status: DC | PRN

## 2017-02-03 NOTE — Progress Notes (Signed)
BP re-check 142/90

## 2017-02-03 NOTE — Discharge Instructions (Signed)
Hypertension During Pregnancy Hypertension is also called high blood pressure. High blood pressure means that the force of your blood moving in your body is too strong. When you are pregnant, this condition should be watched carefully. It can cause problems for you and your baby. Follow these instructions at home: Eating and drinking  Drink enough fluid to keep your pee (urine) clear or pale yellow.  Eat healthy foods that are low in salt (sodium). ? Do not add salt to your food. ? Check labels on foods and drinks to see much salt is in them. Look on the label where you see "Sodium." Lifestyle  Do not use any products that contain nicotine or tobacco, such as cigarettes and e-cigarettes. If you need help quitting, ask your doctor.  Do not use alcohol.  Avoid caffeine.  Avoid stress. Rest and get plenty of sleep. General instructions  Take over-the-counter and prescription medicines only as told by your doctor.  While lying down, lie on your left side. This keeps pressure off your baby.  While sitting or lying down, raise (elevate) your feet. Try putting some pillows under your lower legs.  Exercise regularly. Ask your doctor what kinds of exercise are best for you.  Keep all prenatal and follow-up visits as told by your doctor. This is important. Contact a doctor if:  You have symptoms that your doctor told you to watch for, such as: ? Fever. ? Throwing up (vomiting). ? Headache. Get help right away if:  You have very bad pain in your belly (abdomen).  You are throwing up, and this does not get better with treatment.  You suddenly get swelling in your hands, ankles, or face.  You gain 4 lb (1.8 kg) or more in 1 week.  You get bleeding from your vagina.  You have blood in your pee.  You do not feel your baby moving as much as normal.  You have a change in vision.  You have muscle twitching or sudden tightening (spasms).  You have trouble breathing.  Your lips  or fingernails turn blue. This information is not intended to replace advice given to you by your health care provider. Make sure you discuss any questions you have with your health care provider. Document Released: 06/04/2010 Document Revised: 01/12/2016 Document Reviewed: 01/12/2016 Elsevier Interactive Patient Education  2017 [employer]  

## 2017-02-03 NOTE — Progress Notes (Signed)
Routine Prenatal Care Visit  Subjective  Rebecca Gomez is a 26 y.o. G1P0 at [redacted]w[redacted]d being seen today for ongoing prenatal care.  She is currently monitored for the following issues for this low-risk pregnancy and has Encounter for supervision of low-risk pregnancy, antepartum and Abnormal glucose tolerance affecting pregnancy, antepartum on her problem list.  ----------------------------------------------------------------------------------- Patient reports a headache last night that has resolved today. She also notticed new pedal edema last night that is still present today, although slightly improved per her report. Denies visual changes and epigastric/abdominal pain.  Contractions: Not present. Vag. Bleeding: None.  Movement: Present. Denies leaking of fluid.  ----------------------------------------------------------------------------------- The following portions of the patient's history were reviewed and updated as appropriate: allergies, current medications, past family history, past medical history, past social history, past surgical history and problem list. Problem list updated.   Objective  Blood pressure (!) 148/82, weight 216 lb (98 kg), last menstrual period 05/30/2016. Pregravid weight 189 lb (85.7 kg) Total Weight Gain 27 lb (12.2 kg) Urinalysis: Urine Protein: Trace Urine Glucose: Negative  Fetal Status: Fetal Heart Rate (bpm): 150 Fundal Height: 36 cm Movement: Present     General:  Alert, oriented and cooperative. Patient is in no acute distress.  Skin: Skin is warm and dry. No rash noted.   Cardiovascular: Normal heart rate noted  Respiratory: Normal respiratory effort, no problems with respiration noted  Abdomen: Soft, gravid, appropriate for gestational age. Pain/Pressure: Absent     Pelvic:  Cervical exam deferred        Extremities: Normal range of motion.  Edema: Trace  Mental Status: Normal mood and affect. Normal behavior. Normal judgment and thought content.      Assessment   26 y.o. G1P0 at [redacted]w[redacted]d by  03/06/2017, by Last Menstrual Period presenting for routine prenatal visit  Plan   pregnancy 1 Problems (from 05/30/16 to present)    Problem Noted Resolved   Encounter for supervision of low-risk pregnancy, antepartum 08/01/2016 by Nadara Mustard, MD No   Overview Addendum 12/30/2016 12:34 PM by Conard Novak, MD    Clinic [facility]  Dating LMP, confirmed with u/s  Genetic Screen 1 Screen:  declines  AFP:     Quad:     NIPS:  Anatomic [place] Incomplete 6.1.18  F/U at 24 weeks  GTT Early: passed, 3rd trimester FAILED               Third trimester:  passed all 4 values  Rhogam   TDaP vaccine                       Flu Shot:  Baby Food                                 Contraception   CBB    CS/VBAC   Support Person   Obesity, BMI 33          Prenatal Labs  Blood type:   O POS  Antibody: neg  Rubella:   Immune Varicella: Immune  RPR:   neg  HBsAg:   neg  HIV:   neg  GBS:   Pap:NIL 08/01/2016                  BP recheck 140/98 and 142/90. Advised patient to report to L&D through the ED now.  Preterm labor symptoms and general obstetric precautions including but not  limited to vaginal bleeding, contractions, leaking of fluid and fetal movement were reviewed in detail with the patient.  Return in about 1 week (around 02/10/2017) for ROB.  Marcelyn Bruins, CNM 02/03/2017  10:11 AM

## 2017-02-03 NOTE — Final Progress Note (Signed)
Physician Final Progress Note  Patient ID: Rebecca Gomez MRN: [id number] DOB/AGE: 09-06-1990 26 y.o.  Admit date: 02/03/2017 Admitting provider: Nadara Mustard, MD Discharge date: 02/03/2017   Admission Diagnoses: Elevated blood pressure and headache, 35 weeks  Discharge Diagnoses:  Active Problems:   Hypertension affecting pregnancy  Consults: None  Significant Findings/ Diagnostic Studies:  Obstetrics Admission History & Physical   Hypertension   HPI:  27 y.o. G1P0 @ [redacted]w[redacted]d (03/06/2017, by Last Menstrual Period). Admitted on 02/03/2017:   Patient Active Problem List   Diagnosis Date Noted  . Hypertension affecting pregnancy 02/03/2017  . Abnormal glucose tolerance affecting pregnancy, antepartum 12/22/2016  . Encounter for supervision of low-risk pregnancy, antepartum 08/01/2016     Presents for elevated BP and headache.   Prenatal care at: at [facility]. Pregnancy complicated by Obesity.  ROS: A review of systems was performed and negative, except as stated in the above HPI. PMHx:  Past Medical History:  Diagnosis Date  . Medical history non-contributory    PSHx:  Past Surgical History:  Procedure Laterality Date  . NO PAST SURGERIES     Medications:  Prescriptions Prior to Admission  Medication Sig Dispense Refill Last Dose  . Prenatal Vit-Fe Fumarate-FA (MULTIVITAMIN-PRENATAL) 27-0.8 MG TABS tablet Take 1 tablet by mouth daily at 12 noon.   Unknown at Unknown time   Allergies: has No Known Allergies. OBHx:  OB History  Gravida Para Term Preterm AB Living  1            SAB TAB Ectopic Multiple Live Births               # Outcome Date GA Lbr Len/2nd Weight Sex Delivery Anes PTL Lv  1 Current              WGN:FAOZHYQM/VHQIONGEXBMW except as detailed in HPI.Marland Kitchen  No family history of birth defects. Soc Hx: No tob or drugs  Objective:   Vitals:   02/03/17 1238 02/03/17 1308  BP: 130/88 (!) 145/88  Pulse: 85 88  Resp:    Temp:     Constitutional:  Well nourished, well developed female in no acute distress.  HEENT: normal Skin: Warm and dry.  Cardiovascular:Regular rate and rhythm.   Extremity: trace to 1+ bilateral pedal edema Respiratory: Clear to auscultation bilateral. Normal respiratory effort Abdomen: mild Back: no CVAT Neuro: DTRs 2+, Cranial nerves grossly intact Psych: Alert and Oriented x3. No memory deficits. Normal mood and affect.  MS: normal gait, normal bilateral lower extremity ROM/strength/stability.  Procedures: A NST procedure was performed with FHR monitoring and a normal baseline established, appropriate time of 20-40 minutes of evaluation, and accels >2 seen w 15x15 characteristics.  Results show a REACTIVE NST.   Discharge Condition: good  Disposition: Final discharge disposition not confirmed  Diet: Regular diet  Discharge Activity: Activity as tolerated   Allergies as of 02/03/2017   No Known Allergies     Medication List    TAKE these medications   multivitamin-prenatal 27-0.8 MG Tabs tablet Take 1 tablet by mouth daily at 12 noon.      Follow-up Information    Nadara Mustard, MD. Schedule an appointment as soon as possible for a visit.   Specialty:  Obstetrics and Gynecology Why:  BP check in office MONDAY Contact information: [address] [place] [address] [phone]           Total time spent taking care of this patient: 15 minutes  Signed: Letitia Libra 02/03/2017,  1:51 PM

## 2017-02-03 NOTE — Discharge Summary (Signed)
  See FPN 

## 2017-02-03 NOTE — OB Triage Note (Signed)
Sent over from office for elevated BP. Pt denies any other complaints. States baby is moving well. No headache at this time but did have one yesterday.

## 2017-02-06 ENCOUNTER — Ambulatory Visit (INDEPENDENT_AMBULATORY_CARE_PROVIDER_SITE_OTHER): Payer: BLUE CROSS/BLUE SHIELD | Admitting: Maternal Newborn

## 2017-02-06 VITALS — BP 132/82 | Wt 214.0 lb

## 2017-02-06 DIAGNOSIS — Z3490 Encounter for supervision of normal pregnancy, unspecified, unspecified trimester: Secondary | ICD-10-CM

## 2017-02-06 DIAGNOSIS — Z3A36 36 weeks gestation of pregnancy: Secondary | ICD-10-CM

## 2017-02-06 NOTE — Progress Notes (Signed)
BP check. Pt stated values over the weekend were mostly normal with only one high reading. GBS today.

## 2017-02-06 NOTE — Progress Notes (Signed)
Routine Prenatal Care Visit  Subjective  Rebecca Gomez is a 26 y.o. G1P0 at [redacted]w[redacted]d being seen today for ongoing prenatal care.  She is currently monitored for the following issues for this low-risk pregnancy and has Encounter for supervision of low-risk pregnancy, antepartum; Abnormal glucose tolerance affecting pregnancy, antepartum; and Hypertension affecting pregnancy on her problem list.  ----------------------------------------------------------------------------------- Patient reports no complaints.  Denies headache, epigastric pain, worsening edema. Contractions: Not present. Vag. Bleeding: None.  Movement: Present. Denies leaking of fluid.  ----------------------------------------------------------------------------------- The following portions of the patient's history were reviewed and updated as appropriate: allergies, current medications, past family history, past medical history, past social history, past surgical history and problem list. Problem list updated.   Objective  Blood pressure 132/82, weight 214 lb (97.1 kg), last menstrual period 05/30/2016. Pregravid weight 189 lb (85.7 kg) Total Weight Gain 25 lb (11.3 kg) Urinalysis: Urine Protein: Negative Urine Glucose: Negative  Fetal Status: Fetal Heart Rate (bpm): 147 Fundal Height: 36 cm Movement: Present     General:  Alert, oriented and cooperative. Patient is in no acute distress.  Skin: Skin is warm and dry. No rash noted.   Cardiovascular: Normal heart rate noted  Respiratory: Normal respiratory effort, no problems with respiration noted  Abdomen: Soft, gravid, appropriate for gestational age. Pain/Pressure: Absent     Pelvic:  Cervical exam performed Dilation: Closed Effacement (%): Thick Station: Ballotable  Extremities: Normal range of motion.  Edema: Trace  Mental Status: Normal mood and affect. Normal behavior. Normal judgment and thought content.     Assessment   26 y.o. G1P0 at [redacted]w[redacted]d by  03/06/2017, by  Last Menstrual Period presenting for routine prenatal visit.  Plan   pregnancy 1 Problems (from 05/30/16 to present)    Problem Noted Resolved   Encounter for supervision of low-risk pregnancy, antepartum 08/01/2016 by Nadara Mustard, MD No   Overview Addendum 12/30/2016 12:34 PM by Conard Novak, MD    [facility]  Dating LMP, confirmed with u/s  Genetic Screen 1 Screen:  declines  AFP:     Quad:     NIPS:  Anatomic [place] Incomplete 6.1.18  F/U at 24 weeks  GTT Early: passed, 3rd trimester FAILED               Third trimester:  passed all 4 values  Rhogam   TDaP vaccine                       Flu Shot:  Baby Food                                 Contraception   CBB    CS/VBAC   Support Person   Obesity, BMI 33          Prenatal Labs  Blood type:   O POS  Antibody: neg  Rubella:   Immune Varicella: Immune  RPR:   neg  HBsAg:   neg  HIV:   neg  GBS:   Pap:NIL 08/01/2016                GBS and GC collected today.   Advised patient to continue checking BP at home, and to return to care with elevated readings and/or sx of pre-eclampsia.  Preterm labor symptoms and general obstetric precautions including but not limited to vaginal bleeding, contractions, leaking of fluid and fetal movement were  reviewed in detail with the patient.  Return in about 1 week (around 02/13/2017) for ROB.  Marcelyn Bruins, CNM 02/06/2017  11:20 AM

## 2017-02-08 LAB — STREP GP B NAA: Strep Gp B NAA: NEGATIVE

## 2017-02-08 LAB — GC/CHLAMYDIA PROBE AMP
Chlamydia trachomatis, NAA: NEGATIVE
Neisseria gonorrhoeae by PCR: NEGATIVE

## 2017-02-09 ENCOUNTER — Encounter: Payer: BLUE CROSS/BLUE SHIELD | Admitting: Maternal Newborn

## 2017-02-13 ENCOUNTER — Inpatient Hospital Stay
Admission: EM | Admit: 2017-02-13 | Discharge: 2017-02-16 | DRG: 833 | Disposition: A | Discharge status: Home / Self Care | Payer: BLUE CROSS/BLUE SHIELD | Attending: Obstetrics and Gynecology | Admitting: Obstetrics and Gynecology

## 2017-02-13 ENCOUNTER — Ambulatory Visit (INDEPENDENT_AMBULATORY_CARE_PROVIDER_SITE_OTHER): Payer: BLUE CROSS/BLUE SHIELD | Admitting: Obstetrics and Gynecology

## 2017-02-13 VITALS — BP 140/92 | Wt 216.0 lb

## 2017-02-13 DIAGNOSIS — O139 Gestational [pregnancy-induced] hypertension without significant proteinuria, unspecified trimester: Secondary | ICD-10-CM | POA: Diagnosis present

## 2017-02-13 DIAGNOSIS — O99810 Abnormal glucose complicating pregnancy: Secondary | ICD-10-CM

## 2017-02-13 DIAGNOSIS — Z3A37 37 weeks gestation of pregnancy: Secondary | ICD-10-CM

## 2017-02-13 DIAGNOSIS — O133 Gestational [pregnancy-induced] hypertension without significant proteinuria, third trimester: Principal | ICD-10-CM | POA: Diagnosis present

## 2017-02-13 DIAGNOSIS — R609 Edema, unspecified: Secondary | ICD-10-CM | POA: Diagnosis present

## 2017-02-13 DIAGNOSIS — O163 Unspecified maternal hypertension, third trimester: Secondary | ICD-10-CM | POA: Diagnosis present

## 2017-02-13 DIAGNOSIS — Z3490 Encounter for supervision of normal pregnancy, unspecified, unspecified trimester: Secondary | ICD-10-CM

## 2017-02-13 LAB — CBC
HEMATOCRIT: 33.1 % — AB (ref 35.0–47.0)
HEMOGLOBIN: 11.1 g/dL — AB (ref 12.0–16.0)
MCH: 28 pg (ref 26.0–34.0)
MCHC: 33.4 g/dL (ref 32.0–36.0)
MCV: 83.8 fL (ref 80.0–100.0)
Platelets: 311 10*3/uL (ref 150–440)
RBC: 3.96 MIL/uL (ref 3.80–5.20)
RDW: 14.6 % — ABNORMAL HIGH (ref 11.5–14.5)
WBC: 12.7 10*3/uL — ABNORMAL HIGH (ref 3.6–11.0)

## 2017-02-13 LAB — LACTATE DEHYDROGENASE: LDH: 95 U/L — ABNORMAL LOW (ref 98–192)

## 2017-02-13 LAB — PROTEIN / CREATININE RATIO, URINE
Creatinine, Urine: 94 mg/dL
Protein Creatinine Ratio: 0.15 mg/mg{Cre} (ref 0.00–0.15)
TOTAL PROTEIN, URINE: 14 mg/dL

## 2017-02-13 LAB — COMPREHENSIVE METABOLIC PANEL
ALK PHOS: 164 U/L — AB (ref 38–126)
ALT: 11 U/L — AB (ref 14–54)
AST: 23 U/L (ref 15–41)
Albumin: 2.9 g/dL — ABNORMAL LOW (ref 3.5–5.0)
Anion gap: 10 (ref 5–15)
BUN: 11 mg/dL (ref 6–20)
CALCIUM: 9 mg/dL (ref 8.9–10.3)
CO2: 22 mmol/L (ref 22–32)
CREATININE: 0.7 mg/dL (ref 0.44–1.00)
Chloride: 103 mmol/L (ref 101–111)
GFR calc Af Amer: >60 mL/min (ref >60)
GFR calc non Af Amer: >60 mL/min (ref >60)
GLUCOSE: 141 mg/dL — AB (ref 65–99)
Potassium: 4 mmol/L (ref 3.5–5.1)
Sodium: 135 mmol/L (ref 135–145)
Total Bilirubin: 0.3 mg/dL (ref 0.3–1.2)
Total Protein: 6.5 g/dL (ref 6.5–8.1)

## 2017-02-13 LAB — TYPE AND SCREEN
ABO/RH(D): O POS
ANTIBODY SCREEN: NEGATIVE

## 2017-02-13 LAB — URIC ACID: Uric Acid, Serum: 5.8 mg/dL (ref 2.3–6.6)

## 2017-02-13 MED ORDER — CALCIUM CARBONATE ANTACID 500 MG PO CHEW
1.0000 | CHEWABLE_TABLET | Freq: Three times a day (TID) | ORAL | Status: DC
  Administered 2017-02-14 – 2017-02-16 (×6): 200 mg via ORAL
  Filled 2017-02-13 (×6): qty 1

## 2017-02-13 MED ORDER — INFLUENZA VAC SPLIT QUAD 0.5 ML IM SUSY: 0.5000 mL | PREFILLED_SYRINGE | INTRAMUSCULAR | Status: DC

## 2017-02-13 MED ORDER — ONDANSETRON HCL 4 MG/2ML IJ SOLN
4.0000 mg | Freq: Four times a day (QID) | INTRAMUSCULAR | Status: DC | PRN
  Administered 2017-02-15: 4 mg via INTRAVENOUS
  Filled 2017-02-13: qty 2

## 2017-02-13 MED ORDER — LACTATED RINGERS IV SOLN
INTRAVENOUS | Status: DC
  Administered 2017-02-13 – 2017-02-16 (×8): via INTRAVENOUS

## 2017-02-13 MED ORDER — SOD CITRATE-CITRIC ACID 500-334 MG/5ML PO SOLN
ORAL | Status: AC
  Filled 2017-02-13: qty 15

## 2017-02-13 MED ORDER — AMMONIA AROMATIC IN INHA
RESPIRATORY_TRACT | Status: AC
  Filled 2017-02-13: qty 10

## 2017-02-13 MED ORDER — ACETAMINOPHEN 325 MG PO TABS
650.0000 mg | ORAL_TABLET | ORAL | Status: DC | PRN
  Administered 2017-02-15: 650 mg via ORAL
  Filled 2017-02-13: qty 2

## 2017-02-13 MED ORDER — MISOPROSTOL 200 MCG PO TABS
ORAL_TABLET | ORAL | Status: AC
  Administered 2017-02-14: 25 ug via VAGINAL
  Filled 2017-02-13: qty 4

## 2017-02-13 MED ORDER — OXYTOCIN 10 UNIT/ML IJ SOLN
INTRAMUSCULAR | Status: AC
  Filled 2017-02-13: qty 2

## 2017-02-13 MED ORDER — ACETAMINOPHEN 325 MG PO TABS: 650.0000 mg | ORAL_TABLET | ORAL | Status: DC | PRN

## 2017-02-13 MED ORDER — OXYTOCIN 40 UNITS IN LACTATED RINGERS INFUSION - SIMPLE MED
2.5000 [IU]/h | INTRAVENOUS | Status: DC
  Filled 2017-02-13: qty 1000

## 2017-02-13 MED ORDER — LACTATED RINGERS IV SOLN: 500.0000 mL | INTRAVENOUS | Status: DC | PRN

## 2017-02-13 MED ORDER — MISOPROSTOL 25 MCG QUARTER TABLET
ORAL_TABLET | ORAL | Status: AC
  Administered 2017-02-13: 25 ug via BUCCAL
  Filled 2017-02-13: qty 1

## 2017-02-13 MED ORDER — LIDOCAINE HCL (PF) 1 % IJ SOLN
INTRAMUSCULAR | Status: AC
  Filled 2017-02-13: qty 30

## 2017-02-13 MED ORDER — SOD CITRATE-CITRIC ACID 500-334 MG/5ML PO SOLN
30.0000 mL | Freq: Once | ORAL | Status: AC
  Administered 2017-02-13: 30 mL via ORAL

## 2017-02-13 MED ORDER — MISOPROSTOL 25 MCG QUARTER TABLET
25.0000 ug | ORAL_TABLET | ORAL | Status: DC
  Administered 2017-02-13 – 2017-02-14 (×3): 25 ug via BUCCAL
  Filled 2017-02-13 (×2): qty 1

## 2017-02-13 MED ORDER — OXYTOCIN BOLUS FROM INFUSION: 500.0000 mL | Freq: Once | INTRAVENOUS | Status: DC

## 2017-02-13 NOTE — Progress Notes (Signed)
ROB

## 2017-02-13 NOTE — Progress Notes (Signed)
  Labor Progress Note   26 y.o. G1P0 @ [redacted]w[redacted]d, with gestational hypertension, admitted for induction of labor.  Subjective:  Resting in bed, comfortable, had some heartburn that was relieved with medication.  Objective:  BP (!) 152/99   Pulse 94   Temp 98.3 F (36.8 C) (Oral)   Resp 20   Ht  (1.6 m)   Wt 216 lb (98 kg)   LMP 05/30/2016   SpO2 100%   BMI 38.26 kg/m  Abd: mild Extr: trace to 1+ bilateral pedal edema SVE: CERVIX: 0.5 cm dilated, thick, -3 station, cervical position posterior, consistency soft (RN exam at 2110 - no change from previous exam).  EFM: FHR: 140 bpm, variability: moderate,  accelerations:  Present,  decelerations:  Absent Toco: contractions every 3-5 minutes. Labs: I have reviewed the patient's lab results.   Assessment & Plan:  G1P0 @ [redacted]w[redacted]d, admitted for  Pregnancy and Labor/Delivery Management: Induction of Labor for gestational hypertension  1. Pain management: TBD as labor progresses. 2. FWB: FHT category I.  3. ID: GBS negative 4. Labor management: continue to monitor, administer next dose of Cytotec as scheduled in setting of unchanged cervix.  All discussed with patient, see orders.  Marcelyn Bruins, CNM 02/13/2017  11:12 PM

## 2017-02-13 NOTE — H&P (Signed)
Obstetrics Admission History & Physical   PIH   HPI:  26 y.o. G1P0 @ [redacted]w[redacted]d (03/06/2017, by Last Menstrual Period). Admitted on 02/13/2017:   Patient Active Problem List   Diagnosis Date Noted  . Gestational hypertension 02/13/2017  . Hypertension affecting pregnancy 02/03/2017  . Abnormal glucose tolerance affecting pregnancy, antepartum 12/22/2016  . Encounter for supervision of low-risk pregnancy, antepartum 08/01/2016     Presents for elevated blood pressure of 140/92 at routine OB appointment today. Denies headache, visual changes, epigastric pain. Consistently mild range elevated pressures observed in triage: systolic range 125-144 and diastolic range 83-96.  P[facility]atal care at: at [facility]. Pregnancy complicated by gestational HTN.  ROS: A review of systems was performed and negative, except as stated in the above HPI. PMHx:  Past Medical History:  Diagnosis Date  . Medical history non-contributory    PSHx:  Past Surgical History:  Procedure Laterality Date  . NO PAST SURGERIES     Medications:  Prescriptions Prior to Admission  Medication Sig Dispense Refill Last Dose  . Prenatal Vit-Fe Fumarate-FA (MULTIVITAMIN-PRENATAL) 27-0.8 MG TABS tablet Take 1 tablet by mouth daily at 12 noon.   02/13/2017 at Unknown time   Allergies: has No Known Allergies. OBHx:  OB History  Gravida Para Term Preterm AB Living  1            SAB TAB Ectopic Multiple Live Births               # Outcome Date GA Lbr Len/2nd Weight Sex Delivery Anes PTL Lv  1 Current              QIO:NGEXBMWU/XLKGMWNUUVOZ except as detailed in HPI.Marland Kitchen  No family history of birth defects. Soc Hx: Never smoker, Alcohol: none and Recreational drug use: none  Objective:   Vitals:   02/13/17 1615 02/13/17 1713  BP: (!) 143/92 (!) 141/92  Pulse: 91 92  Resp:    Temp:  98.1 F (36.7 C)  SpO2:     Constitutional: Well nourished, well developed female in no acute distress.  HEENT: normal Skin: Warm and dry.   Cardiovascular:Regular rate and rhythm.   Extremity: trace to 1+ bilateral pedal edema Respiratory: Clear to auscultation bilateral. Normal respiratory effort Abdomen: non-tender Back: no CVAT Neuro: DTRs 2+, Cranial nerves grossly intact Psych: Alert and Oriented x3. No memory deficits. Normal mood and affect.  MS: normal gait, normal bilateral lower extremity ROM/strength/stability.  Vagina: within normal limits and with normal mucosa, no blood in the vault Cervix: 0.5 cm/thick/-3 (RN exam) Uterus: occasional contractions  Adnexa: not evaluated  EFM:FHR: 135 bpm, variability: moderate,  accelerations:  Present,  decelerations:  Absent Toco: occasional contraction   Perinatal info:  Blood type: O positive Rubella - Immune Varicella - Immune TDaP Given during third trimester of this pregnancy: 01/02/2017 RPR NR / HIV Neg/ HBsAg Neg   Assessment & Plan:   26 y.o. G1P0 @ [redacted]w[redacted]d, Admitted on 02/13/2017: with gestational hypertension for induction of labor at term. Proceed with Cytotec given unripe cervix.  Admit for labor, Observe for cervical change, Fetal Wellbeing Reassuring, Epidural when ready, AROM when Appropriate and GBS status negative, treat as needed  Marcelyn Bruins, CNM [facility], [facility] 02/13/2017  5:42 PM

## 2017-02-13 NOTE — OB Triage Note (Signed)
Pt presents from [facility] office for PIH workup and possible induction. No clonus, 2+ reflexes. Lungs clear. Denies any HA, epigastric pain, blurred vision, bleeding, or LOF. Reports positive fetal movement. BP cycling q3mins. Will continue to monitor.

## 2017-02-13 NOTE — Progress Notes (Signed)
TO L&D for IOL given elevated BP at 35 weeks and today and now at 37 weeks

## 2017-02-14 DIAGNOSIS — O133 Gestational [pregnancy-induced] hypertension without significant proteinuria, third trimester: Secondary | ICD-10-CM

## 2017-02-14 DIAGNOSIS — R609 Edema, unspecified: Secondary | ICD-10-CM

## 2017-02-14 DIAGNOSIS — Z3A37 37 weeks gestation of pregnancy: Secondary | ICD-10-CM

## 2017-02-14 LAB — RPR: RPR: NONREACTIVE

## 2017-02-14 MED ORDER — OXYTOCIN 40 UNITS IN LACTATED RINGERS INFUSION - SIMPLE MED
1.0000 m[IU]/min | INTRAVENOUS | Status: DC
  Administered 2017-02-14: 24 m[IU]/min via INTRAVENOUS
  Administered 2017-02-14 – 2017-02-15 (×4): 2 m[IU]/min via INTRAVENOUS
  Filled 2017-02-14 (×2): qty 1000

## 2017-02-14 MED ORDER — TERBUTALINE SULFATE 1 MG/ML IJ SOLN: 0.2500 mg | Freq: Once | INTRAMUSCULAR | Status: DC | PRN

## 2017-02-14 MED ORDER — MISOPROSTOL 25 MCG QUARTER TABLET
25.0000 ug | ORAL_TABLET | ORAL | Status: DC
  Administered 2017-02-14 – 2017-02-15 (×2): 25 ug via VAGINAL
  Filled 2017-02-14 (×2): qty 1

## 2017-02-14 NOTE — Progress Notes (Signed)
  Labor Progress Note   26 y.o. G1P0 @ [redacted]w[redacted]d , admitted for  Pregnancy, Labor Management. Induction of Labor for gestational hypertension  Subjective:  Patient has been sleeping. She was feeling contractions before she went to sleep and now they are mild. Discussion of labor management with verbalized understanding.   Objective:  BP 138/87   Pulse 89   Temp 98.1 F (36.7 C) (Oral)   Resp 20   Ht  (1.6 m)   Wt 216 lb (98 kg)   LMP 05/30/2016   SpO2 100%   BMI 38.26 kg/m  Abd: mild Extr: trace to 1+ bilateral pedal edema SVE: CERVIX: 1.5 cm dilated, 60 effaced, -2 station, moving more midline Cervical sweep done   EFM: FHR: 145 bpm, variability: moderate,  accelerations:  Present,  decelerations:  Absent Toco: Frequency:  Difficult to assess with patient side lying, appears to be infrequent Labs: I have reviewed the patient's lab results.   Assessment & Plan:  G1P0 @ [redacted]w[redacted]d, admitted for  Pregnancy and Labor/Delivery Management  1. Pain management: none. 2. FWB: FHT category I.  3. ID: GBS negative 4. Labor management: cervical sweep, continue titrating pitocin and adjust Toco monitor  All discussed with patient, see orders  Tresea Mall, CNM 02/14/2017  4:16 PM

## 2017-02-14 NOTE — Progress Notes (Signed)
  Labor Progress Note   26 y.o. G1P0 @ [redacted]w[redacted]d , admitted for  Pregnancy, Labor Management. Induction of Labor for gestational hypertension  Subjective:  Patient has been sleeping. She is uncertain how frequently she feels contractions. Discussion of labor management with verbalized understanding.   Objective:  BP 138/87   Pulse 89   Temp 98.1 F (36.7 C) (Oral)   Resp 20   Ht  (1.6 m)   Wt 216 lb (98 kg)   LMP 05/30/2016   SpO2 100%   BMI 38.26 kg/m  Abd: mild Extr: trace to 1+ bilateral pedal edema SVE: CERVIX: 1 cm dilated, 60 effaced, -3 station, posterior Bishop Score: 4  EFM: FHR: 145 bpm, variability: moderate,  accelerations:  Present,  decelerations:  Absent Toco: Frequency: Every 8-10 minutes Labs: I have reviewed the patient's lab results.   Assessment & Plan:  G1P0 @ [redacted]w[redacted]d, admitted for  Pregnancy and Labor/Delivery Management  1. Pain management: none. 2. FWB: FHT category I.  3. ID: GBS negative 4. Labor management: start pitocin  All discussed with patient, see orders  Tresea Mall, CNM 02/14/2017  12:12 PM

## 2017-02-14 NOTE — Progress Notes (Signed)
At 2005, Rebecca Gomez CNM at bedside.  Plan of care discussed.  Assuming continued fetal tolerance, she would like [place] to continue to increase pitocin by 2 milliunits until adequate contraction pattern is achieved.  Pt on 28 milliunits of pitocin at this time.

## 2017-02-14 NOTE — Progress Notes (Signed)
Pt currently on 20 milliunits of Pitocin. Spoke with  Provider Tresea Mall CNM and she would like to continue to increase the pitocin by 2 milliunits q 30 minutes until pt adequate. Will conitnue to monitor.

## 2017-02-14 NOTE — Progress Notes (Signed)
  Labor Progress Note   26 y.o. G1P0 @ [redacted]w[redacted]d , admitted for  Pregnancy, Labor Management. IOL for GHTN  Subjective:  Patient states contractions have been more painful. She is not yet requesting pain medicine.  Objective:  BP (!) 137/91   Pulse 87   Temp 97.9 F (36.6 C) (Oral)   Resp 18   Ht  (1.6 m)   Wt 216 lb (98 kg)   LMP 05/30/2016   SpO2 100%   BMI 38.26 kg/m  Abd: mild Extr: trace to 1+ bilateral pedal edema SVE: CERVIX: 1.5 cm dilated, 60 effaced, -2 station, continues to be posterior  EFM: FHR: 140 bpm, variability: moderate,  accelerations:  Present,  decelerations:  Absent Toco: Frequency: Every 2-4 minutes Labs: I have reviewed the patient's lab results.   Assessment & Plan:  G1P0 @ [redacted]w[redacted]d, admitted for  Pregnancy and Labor/Delivery Management  1. Pain management: none. 2. FWB: FHT category I.  3. ID: GBS negative 4. Labor management: Continue titrating pitocin to adequate pattern  All discussed with patient, see orders  Tresea Mall, [facility] 02/14/2017  8:17 PM

## 2017-02-15 DIAGNOSIS — R609 Edema, unspecified: Secondary | ICD-10-CM

## 2017-02-15 DIAGNOSIS — O133 Gestational [pregnancy-induced] hypertension without significant proteinuria, third trimester: Secondary | ICD-10-CM

## 2017-02-15 DIAGNOSIS — Z3A37 37 weeks gestation of pregnancy: Secondary | ICD-10-CM

## 2017-02-15 MED ORDER — SODIUM CHLORIDE FLUSH 0.9 % IV SOLN
INTRAVENOUS | Status: AC
  Filled 2017-02-15: qty 10

## 2017-02-15 MED ORDER — MISOPROSTOL 25 MCG QUARTER TABLET
50.0000 ug | ORAL_TABLET | Freq: Once | ORAL | Status: AC
  Administered 2017-02-15: 50 ug via VAGINAL
  Filled 2017-02-15: qty 2

## 2017-02-15 MED ORDER — SODIUM CHLORIDE 0.9 % IJ SOLN
INTRAMUSCULAR | Status: AC
  Filled 2017-02-15: qty 50

## 2017-02-15 MED ORDER — BUTORPHANOL TARTRATE 2 MG/ML IJ SOLN
INTRAMUSCULAR | Status: AC
  Filled 2017-02-15: qty 1

## 2017-02-15 MED ORDER — BUTORPHANOL TARTRATE 2 MG/ML IJ SOLN
1.0000 mg | INTRAMUSCULAR | Status: DC | PRN
  Administered 2017-02-15 (×3): 1 mg via INTRAVENOUS
  Filled 2017-02-15 (×3): qty 1

## 2017-02-15 NOTE — Progress Notes (Signed)
   Subjective:  Increasingly painful contraction, no headaches  Objective:   Vitals: Blood pressure 139/71, pulse 66, temperature 98.5 F (36.9 C), temperature source Axillary, resp. rate 16, height  (1.6 m), weight 216 lb (98 kg), last menstrual period 05/30/2016, SpO2 100 %. General: NAD Abdomen: gravid, non-tender Cervical Exam:  Dilation: 3 Effacement (%): 70 Cervical Position: Posterior Station: -2 Presentation: Vertex Exam by:: Jenniger Figiel Foley bulb in vagina  FHT: 140, moderate, +accels, no decels Toco: q2-18min  No results found for this or any previous visit (from the past 24 hour(s)).  Assessment:   26 y.o. G1P0 [redacted]w[redacted]d IOL GHTN  Plan:   1) Labor -continue pitocin, currently still to high and posterior to proceed with rupture  2) Fetus - cat I  3) GHTN - mild range to normotensive, asymptomatic

## 2017-02-15 NOTE — Progress Notes (Signed)
  Labor Progress Note   26 y.o. G1P0 @ [redacted]w[redacted]d , admitted for  Pregnancy, Labor Management. IOL for GHTN  Subjective:  Patient has slept well since pitocin was turned off. She is ordering breakfast and then restart pitocin  Objective:  BP 121/72 (BP Location: Right Arm)   Pulse 69   Temp 97.7 F (36.5 C) (Oral)   Resp 18   Ht  (1.6 m)   Wt 216 lb (98 kg)   LMP 05/30/2016   SpO2 100%   BMI 38.26 kg/m  Abd: mild Extr: trace to 1+ bilateral pedal edema SVE: CERVIX: 1.5 cm dilated, 60 effaced, -2 station, continues posterior  EFM: FHR: 120 bpm, variability: moderate,  accelerations:  Present,  decelerations:  Absent Toco: Frequency: none Labs: I have reviewed the patient's lab results.   Assessment & Plan:  G1P0 @ [redacted]w[redacted]d, admitted for  Pregnancy and Labor/Delivery Management  1. Pain management: none. 2. FWB: FHT category I.  3. ID: GBS negative 4. Labor management: restart pitocin, consider foley bulb  All discussed with patient, see orders  Tresea Mall, [facility] 02/15/2017  7:43 AM

## 2017-02-15 NOTE — Progress Notes (Addendum)
   Subjective:  No issues, feeling slightly more contractions  Objective:   Vitals: Blood pressure 126/74, pulse 76, temperature 98.5 F (36.9 C), temperature source Oral, resp. rate 18, height  (1.6 m), weight 216 lb (98 kg), last menstrual period 05/30/2016, SpO2 100 %. General:  Abdomen: Cervical Exam:  Dilation: 2 Effacement (%): 70 Cervical Position: Posterior Station: -2 Presentation: Vertex Exam by:: Ajamu Maxon, MD  FHT: 130, moderate, +accels, no decels Toco: q3-47min  No results found for this or any previous visit (from the past 24 hour(s)).  Assessment:   26 y.o. G1P0 [redacted]w[redacted]d IOL GHTN  Plan:   1) Labor - cervical foley bulb placed, switch to pitocin  2) Fetus - cat I tracing  3) GHTN - mild range to normotensive BP

## 2017-02-15 NOTE — Progress Notes (Signed)
   Subjective:  Comfortable, intermittent contraction  Objective:   Vitals: Blood pressure 121/72, pulse 69, temperature 97.7 F (36.5 C), temperature source Oral, resp. rate 18, height  (1.6 m), weight 216 lb (98 kg), last menstrual period 05/30/2016, SpO2 100 %. General:  Abdomen: Cervical Exam:  Dilation: 1 Effacement (%): 60 Cervical Position: Posterior Station: -2 Presentation: Vertex Exam by:: Saniyya Gau, MD  FHT: 130, moderate, +accels, no decels Toco: rare  No results found for this or any previous visit (from the past 24 hour(s)).  Assessment:   26 y.o. G1P0 [redacted]w[redacted]d   Plan:   1) Labor - discontinue pitocin, will switch back to cytotec  2) Fetus - cat I tracing  3) GHTN - normotensive to mild range throughout admission

## 2017-02-16 DIAGNOSIS — R609 Edema, unspecified: Secondary | ICD-10-CM

## 2017-02-16 DIAGNOSIS — O133 Gestational [pregnancy-induced] hypertension without significant proteinuria, third trimester: Secondary | ICD-10-CM

## 2017-02-16 DIAGNOSIS — Z3A37 37 weeks gestation of pregnancy: Secondary | ICD-10-CM

## 2017-02-16 LAB — COMPREHENSIVE METABOLIC PANEL
ALT: 12 U/L — ABNORMAL LOW (ref 14–54)
ANION GAP: 8 (ref 5–15)
AST: 22 U/L (ref 15–41)
Albumin: 2.7 g/dL — ABNORMAL LOW (ref 3.5–5.0)
Alkaline Phosphatase: 144 U/L — ABNORMAL HIGH (ref 38–126)
BILIRUBIN TOTAL: 0.6 mg/dL (ref 0.3–1.2)
BUN: 6 mg/dL (ref 6–20)
CO2: 23 mmol/L (ref 22–32)
Calcium: 9 mg/dL (ref 8.9–10.3)
Chloride: 109 mmol/L (ref 101–111)
Creatinine, Ser: 0.73 mg/dL (ref 0.44–1.00)
Glucose, Bld: 67 mg/dL (ref 65–99)
POTASSIUM: 3.7 mmol/L (ref 3.5–5.1)
Sodium: 140 mmol/L (ref 135–145)
TOTAL PROTEIN: 6.4 g/dL — AB (ref 6.5–8.1)

## 2017-02-16 LAB — CBC
HCT: 33.2 % — ABNORMAL LOW (ref 35.0–47.0)
Hemoglobin: 11.1 g/dL — ABNORMAL LOW (ref 12.0–16.0)
MCH: 27.9 pg (ref 26.0–34.0)
MCHC: 33.5 g/dL (ref 32.0–36.0)
MCV: 83.2 fL (ref 80.0–100.0)
PLATELETS: 276 10*3/uL (ref 150–440)
RBC: 3.99 MIL/uL (ref 3.80–5.20)
RDW: 14.8 % — AB (ref 11.5–14.5)
WBC: 11.8 10*3/uL — ABNORMAL HIGH (ref 3.6–11.0)

## 2017-02-16 NOTE — Discharge Summary (Signed)
Reviewed discharge instructions with patient. Informed patient when to notify provider and signs and symptoms of labor, should she go into labor before she returns. Went over instructions for scheduled induction on Sunday. Gave pt opportunity for questions. All questions answered. Pt discharged home with husband.

## 2017-02-16 NOTE — Discharge Summary (Signed)
Physician Final Progress Note  Patient ID: Rebecca Gomez MRN: [id number] DOB/AGE: Aug 05, 1990 26 y.o.  Admit date: 02/13/2017 Admitting provider: Nadara Mustard, MD Discharge date: 02/16/2017  Admission Diagnoses:  1) intrauterine pregnancy at [redacted]w[redacted]d 2) gestational hypertension  Discharge Diagnoses:  1) intrauterine pregnancy at [redacted]w[redacted]d 2) gestational hypertension  History of Present Illness: The patient is a 26 y.o. female G1P0 at [redacted]w[redacted]d who presented for elevated blood pressures noted at the clinic and was sent to the hospital for confirmation of diagnosis. She was diagnosed with gestational hypertension.    Hospital Course:  Decision was made to induce the patient given gestational age and diagnosis of gestational hypertension.   She underwent attempted induction of labor with misoprostol, pitocin, foley bulb over the course of about three days.  She made little-to-no progress and by 72 hours, her blood pressures occasionally elevated and mostly normal.  Her labs were normal and stable.  It was offered to the patient that she be discharged to return in a few days for another attempt at induction versus continuing induction.  The fetal heart rate tracing was category 1 the entire time and she had a negative contraction stress test.  After much discussion, she and her husband elected to go home and come back in 3 days for attempted induction of labor. Very strict precautions regarding symptoms of headache, visual changes, RUQ pain were given. She was also instructed to closely monitor fetal movement and vaginal bleeding.  She was told to return for the usual obstetrical issues, as well.     Past Medical History:  Diagnosis Date  . Medical history non-contributory     Past Surgical History:  Procedure Laterality Date  . NO PAST SURGERIES      No current facility-administered medications on file prior to encounter.    Current Outpatient Prescriptions on File Prior to Encounter  Medication  Sig Dispense Refill  . Prenatal Vit-Fe Fumarate-FA (MULTIVITAMIN-PRENATAL) 27-0.8 MG TABS tablet Take 1 tablet by mouth daily at 12 noon.      No Known Allergies  Social History   Social History  . Marital status: Married    Spouse name: N/A  . Number of children: N/A  . Years of education: N/A   Occupational History  . Not on file.   Social History Main Topics  . Smoking status: Never Smoker  . Smokeless tobacco: Never Used  . Alcohol use No  . Drug use: No  . Sexual activity: Yes   Other Topics Concern  . Not on file   Social History Narrative  . No narrative on file    Physical Exam: BP 136/73   Pulse (!) 59   Temp 98.1 F (36.7 C) (Oral)   Resp 16   Ht  (1.6 m)   Wt 216 lb (98 kg)   LMP 05/30/2016   SpO2 100%   BMI 38.26 kg/m   Gen: NAD CV: RRR Pulm: CTAB Pelvic: 3cm Ext: no e/c/t  Consults: None  Significant Findings/ Diagnostic Studies:  Lab Results  Component Value Date   WBC 11.8 (H) 02/16/2017   HGB 11.1 (L) 02/16/2017   HCT 33.2 (L) 02/16/2017   PLT 276 02/16/2017   CREATININE 0.73 02/16/2017   ALT 12 (L) 02/16/2017   AST 22 02/16/2017   PROTCRRATIO 0.15 02/13/2017     Procedures:  Attempted induction of labor, fetal monitoring, lab monitoring  Discharge Condition: stable  Disposition: 01-Home or Self Care  Diet: Regular diet  Discharge Activity:  Activity as tolerated   Allergies as of 02/16/2017   No Known Allergies     Medication List    TAKE these medications   multivitamin-prenatal 27-0.8 MG Tabs tablet Take 1 tablet by mouth daily at 12 noon.      Follow up:  Return to [facility] on 02/19/17 in the early afternoon (nursing staff to specify exact time) for re-admission.   Signed: Thomasene Mohair, MD  02/16/2017, 10:06 AM

## 2017-02-16 NOTE — Progress Notes (Signed)
Long discussion held with the patient and her husband along with Dr. Bonney Aid and I regarding hospital course so far and options moving forward.  Options offered were to continue with induction process, though she has made very little change in 3 days.  She is currently 3 cm dilated, though this is a "mechanical" 3cm with a cervix that has not significantly softened or effaced.  Offered to postpone induction as her blood pressures have been very reasonable, she has not developed any signs or symptoms of severe features.  Will repeat labs to ensure no HELLP syndrome picture is developing.  If normal, she can go home and will bring her back in a few days.  If she wants to continue, I would give her a break for several hours, let her get out of bed, and restart the process this afternoon. She would like to think about this. Will revisit in another hour or two, once we've had her labs rechecked.  Thomasene Mohair, MD 02/16/2017 7:48 AM

## 2017-02-19 ENCOUNTER — Inpatient Hospital Stay
Admission: RE | Admit: 2017-02-19 | Discharge: 2017-02-23 | DRG: 787 | Disposition: A | Discharge status: Home / Self Care | Payer: BLUE CROSS/BLUE SHIELD | Source: Ambulatory Visit | Attending: Obstetrics & Gynecology | Admitting: Obstetrics & Gynecology

## 2017-02-19 ENCOUNTER — Encounter: Payer: Self-pay | Admitting: *Deleted

## 2017-02-19 ENCOUNTER — Inpatient Hospital Stay: Payer: BLUE CROSS/BLUE SHIELD | Admitting: Anesthesiology

## 2017-02-19 DIAGNOSIS — Z3A37 37 weeks gestation of pregnancy: Secondary | ICD-10-CM | POA: Diagnosis not present

## 2017-02-19 DIAGNOSIS — O134 Gestational [pregnancy-induced] hypertension without significant proteinuria, complicating childbirth: Secondary | ICD-10-CM | POA: Diagnosis present

## 2017-02-19 DIAGNOSIS — O133 Gestational [pregnancy-induced] hypertension without significant proteinuria, third trimester: Secondary | ICD-10-CM | POA: Diagnosis not present

## 2017-02-19 DIAGNOSIS — Z23 Encounter for immunization: Secondary | ICD-10-CM

## 2017-02-19 DIAGNOSIS — Z3A38 38 weeks gestation of pregnancy: Secondary | ICD-10-CM | POA: Diagnosis not present

## 2017-02-19 DIAGNOSIS — O1495 Unspecified pre-eclampsia, complicating the puerperium: Secondary | ICD-10-CM | POA: Diagnosis not present

## 2017-02-19 DIAGNOSIS — O139 Gestational [pregnancy-induced] hypertension without significant proteinuria, unspecified trimester: Secondary | ICD-10-CM | POA: Diagnosis present

## 2017-02-19 DIAGNOSIS — D62 Acute posthemorrhagic anemia: Secondary | ICD-10-CM | POA: Diagnosis not present

## 2017-02-19 DIAGNOSIS — O9081 Anemia of the puerperium: Secondary | ICD-10-CM | POA: Diagnosis not present

## 2017-02-19 LAB — CBC
HCT: 33.1 % — ABNORMAL LOW (ref 35.0–47.0)
HEMOGLOBIN: 11.2 g/dL — AB (ref 12.0–16.0)
MCH: 28.5 pg (ref 26.0–34.0)
MCHC: 33.8 g/dL (ref 32.0–36.0)
MCV: 84.4 fL (ref 80.0–100.0)
PLATELETS: 283 10*3/uL (ref 150–440)
RBC: 3.93 MIL/uL (ref 3.80–5.20)
RDW: 14.9 % — ABNORMAL HIGH (ref 11.5–14.5)
WBC: 10.7 10*3/uL (ref 3.6–11.0)

## 2017-02-19 LAB — COMPREHENSIVE METABOLIC PANEL
ALT: 14 U/L (ref 14–54)
AST: 31 U/L (ref 15–41)
Albumin: 2.9 g/dL — ABNORMAL LOW (ref 3.5–5.0)
Alkaline Phosphatase: 146 U/L — ABNORMAL HIGH (ref 38–126)
Anion gap: 8 (ref 5–15)
BILIRUBIN TOTAL: 0.4 mg/dL (ref 0.3–1.2)
BUN: 9 mg/dL (ref 6–20)
CHLORIDE: 106 mmol/L (ref 101–111)
CO2: 22 mmol/L (ref 22–32)
Calcium: 8.6 mg/dL — ABNORMAL LOW (ref 8.9–10.3)
Creatinine, Ser: 0.75 mg/dL (ref 0.44–1.00)
Glucose, Bld: 117 mg/dL — ABNORMAL HIGH (ref 65–99)
POTASSIUM: 3.7 mmol/L (ref 3.5–5.1)
Sodium: 136 mmol/L (ref 135–145)
TOTAL PROTEIN: 6.8 g/dL (ref 6.5–8.1)

## 2017-02-19 LAB — PROTEIN / CREATININE RATIO, URINE: CREATININE, URINE: 44 mg/dL

## 2017-02-19 LAB — TYPE AND SCREEN
ABO/RH(D): O POS
ANTIBODY SCREEN: NEGATIVE

## 2017-02-19 MED ORDER — HYDRALAZINE HCL 20 MG/ML IJ SOLN
10.0000 mg | Freq: Once | INTRAMUSCULAR | Status: DC | PRN
  Filled 2017-02-19: qty 0.5

## 2017-02-19 MED ORDER — EPHEDRINE 5 MG/ML INJ: 10.0000 mg | INTRAVENOUS | Status: DC | PRN

## 2017-02-19 MED ORDER — BUPIVACAINE HCL (PF) 0.25 % IJ SOLN
INTRAMUSCULAR | Status: DC | PRN
  Administered 2017-02-19: 10 mL via EPIDURAL

## 2017-02-19 MED ORDER — LIDOCAINE HCL (PF) 1 % IJ SOLN
INTRAMUSCULAR | Status: DC | PRN
  Administered 2017-02-19: 3 mL

## 2017-02-19 MED ORDER — LABETALOL HCL 5 MG/ML IV SOLN
20.0000 mg | INTRAVENOUS | Status: DC | PRN
Start: 2017-02-19 — End: 2017-02-20
  Filled 2017-02-19: qty 16

## 2017-02-19 MED ORDER — BUTORPHANOL TARTRATE 2 MG/ML IJ SOLN
1.0000 mg | INTRAMUSCULAR | Status: DC | PRN
  Administered 2017-02-19 (×2): 1 mg via INTRAVENOUS
  Filled 2017-02-19: qty 1

## 2017-02-19 MED ORDER — DIPHENHYDRAMINE HCL 50 MG/ML IJ SOLN: 12.5000 mg | INTRAMUSCULAR | Status: DC | PRN

## 2017-02-19 MED ORDER — LIDOCAINE-EPINEPHRINE (PF) 1.5 %-1:200000 IJ SOLN
INTRAMUSCULAR | Status: DC | PRN
  Administered 2017-02-19: 3 mL via PERINEURAL

## 2017-02-19 MED ORDER — OXYTOCIN 40 UNITS IN LACTATED RINGERS INFUSION - SIMPLE MED
2.5000 [IU]/h | INTRAVENOUS | Status: DC
  Administered 2017-02-20: 1000 mL via INTRAVENOUS

## 2017-02-19 MED ORDER — FENTANYL 2.5 MCG/ML W/ROPIVACAINE 0.15% IN NS 100 ML EPIDURAL (ARMC)
12.0000 mL/h | EPIDURAL | Status: DC
  Filled 2017-02-19: qty 100

## 2017-02-19 MED ORDER — LACTATED RINGERS IV SOLN: 500.0000 mL | Freq: Once | INTRAVENOUS | Status: DC

## 2017-02-19 MED ORDER — PHENYLEPHRINE 40 MCG/ML (10ML) SYRINGE FOR IV PUSH (FOR BLOOD PRESSURE SUPPORT): 80.0000 ug | PREFILLED_SYRINGE | INTRAVENOUS | Status: DC | PRN

## 2017-02-19 MED ORDER — LACTATED RINGERS IV SOLN: 500.0000 mL | INTRAVENOUS | Status: DC | PRN

## 2017-02-19 MED ORDER — OXYTOCIN 40 UNITS IN LACTATED RINGERS INFUSION - SIMPLE MED
1.0000 m[IU]/min | INTRAVENOUS | Status: DC
  Administered 2017-02-19: 2 m[IU]/min via INTRAVENOUS
  Filled 2017-02-19: qty 1000

## 2017-02-19 MED ORDER — ACETAMINOPHEN 325 MG PO TABS: 650.0000 mg | ORAL_TABLET | ORAL | Status: DC | PRN

## 2017-02-19 MED ORDER — OXYTOCIN BOLUS FROM INFUSION: 500.0000 mL | Freq: Once | INTRAVENOUS | Status: DC

## 2017-02-19 MED ORDER — LIDOCAINE HCL (PF) 1 % IJ SOLN: 30.0000 mL | INTRAMUSCULAR | Status: DC | PRN

## 2017-02-19 MED ORDER — FENTANYL 2.5 MCG/ML W/ROPIVACAINE 0.15% IN NS 100 ML EPIDURAL (ARMC)
EPIDURAL | Status: AC
  Filled 2017-02-19: qty 100

## 2017-02-19 MED ORDER — ONDANSETRON HCL 4 MG/2ML IJ SOLN
4.0000 mg | Freq: Four times a day (QID) | INTRAMUSCULAR | Status: DC | PRN
  Administered 2017-02-19: 4 mg via INTRAVENOUS
  Filled 2017-02-19: qty 2

## 2017-02-19 MED ORDER — TERBUTALINE SULFATE 1 MG/ML IJ SOLN: 0.2500 mg | Freq: Once | INTRAMUSCULAR | Status: DC | PRN

## 2017-02-19 MED ORDER — LACTATED RINGERS IV SOLN
INTRAVENOUS | Status: DC
  Administered 2017-02-19 (×2): via INTRAVENOUS

## 2017-02-19 NOTE — Anesthesia Procedure Notes (Signed)
Epidural Patient location during procedure: OB Start time: 02/19/2017 10:40 PM End time: 02/19/2017 10:53 PM  Staffing Anesthesiologist: Yves Dill Performed: anesthesiologist   Preanesthetic Checklist Completed: patient identified, site marked, surgical consent, pre-op evaluation, timeout performed, IV checked, risks and benefits discussed and monitors and equipment checked  Epidural Patient position: sitting Prep: Betadine Patient monitoring: heart rate, continuous pulse ox and blood pressure Approach: midline Location: L3-L4 Injection technique: LOR air  Needle:  Needle type: Tuohy  Needle gauge: 17 G Needle length: 9 cm and 9 Catheter type: closed end flexible Catheter size: 19 Gauge Test dose: negative and 1.5% lidocaine with Epi 1:200 K  Assessment Events: blood not aspirated, injection not painful, no injection resistance, negative IV test and no paresthesia  Additional Notes Time out called.  Patient placed in sitting position.  Back prepped and draped in sterile fashion.  A skin wheal was made in the L3-L4 interspace with 1% Lidocaine plain.  A 17G Tuohy needle was advanced to the epidural space by a loss of resistance technique.  The epidural catheter was threaded 3 cm and the TD was negative.  The catheter was affixed to the back in sterile fashion.  The patient tolerated the procedure well with help in positioning.Reason for block:procedure for pain

## 2017-02-19 NOTE — Anesthesia Preprocedure Evaluation (Addendum)
Anesthesia Evaluation  Patient identified by MRN, date of birth, ID band Patient awake    Reviewed: Allergy & Precautions, NPO status , Patient's Chart, lab work & pertinent test results  Airway Mallampati: II  TM Distance: >3 FB     Dental  (+) Teeth Intact   Pulmonary neg pulmonary ROS,    Pulmonary exam normal        Cardiovascular hypertension, Normal cardiovascular exam     Neuro/Psych negative neurological ROS  negative psych ROS   GI/Hepatic negative GI ROS, Neg liver ROS,   Endo/Other  negative endocrine ROS  Renal/GU negative Renal ROS  negative genitourinary   Musculoskeletal negative musculoskeletal ROS (+)   Abdominal Normal abdominal exam  (+)   Peds negative pediatric ROS (+)  Hematology negative hematology ROS (+)   Anesthesia Other Findings   Reproductive/Obstetrics (+) Pregnancy                             Anesthesia Physical Anesthesia Plan  ASA: II  Anesthesia Plan: Epidural   Post-op Pain Management:    Induction:   PONV Risk Score and Plan:   Airway Management Planned: Natural Airway  Additional Equipment:   Intra-op Plan:   Post-operative Plan:   Informed Consent: I have reviewed the patients History and Physical, chart, labs and discussed the procedure including the risks, benefits and alternatives for the proposed anesthesia with the patient or authorized representative who has indicated his/her understanding and acceptance.     Dental advisory given  Plan Discussed with: CRNA and Surgeon  Anesthesia Plan Comments:         Anesthesia Quick Evaluation  

## 2017-02-19 NOTE — H&P (Signed)
Obstetrics Admission History & Physical   Chief Complaint: Return to [facility] for scheduled resumption of induction of labor for gestational hypertension   HPI:  26 y.o. G1P0 @ [redacted]w[redacted]d (03/06/2017, by Last Menstrual Period). Admitted on 02/19/2017:   Patient Active Problem List   Diagnosis Date Noted  . Labor and delivery, indication for care 02/19/2017  . Gestational hypertension 02/13/2017  . Hypertension affecting pregnancy 02/03/2017  . Abnormal glucose tolerance affecting pregnancy, antepartum 12/22/2016  . Encounter for supervision of low-risk pregnancy, antepartum 08/01/2016     The patient admits positive fetal movement. She admits to regular every 30 minute contractions since last night and through this morning. She admits increased pelvic pressure. She denies leakage of fluid or vaginal bleeding. She has not checked her blood pressure since she was last admitted. She denies headache, visual changes, epigastric pain. Discussion of plan of care.   P[facility] care at: at [facility]. Pregnancy complicated by gestational HTN.  ROS: A review of systems was performed and negative, except as stated in the above HPI. PMHx:  Past Medical History:  Diagnosis Date  . Medical history non-contributory    PSHx:  Past Surgical History:  Procedure Laterality Date  . NO PAST SURGERIES     Medications:  Prescriptions Prior to Admission  Medication Sig Dispense Refill Last Dose  . Prenatal Vit-Fe Fumarate-FA (MULTIVITAMIN-PRENATAL) 27-0.8 MG TABS tablet Take 1 tablet by mouth daily at 12 noon.   02/19/2017 at Unknown time   Allergies: has No Known Allergies. OBHx:  OB History  Gravida Para Term Preterm AB Living  1            SAB TAB Ectopic Multiple Live Births               # Outcome Date GA Lbr Len/2nd Weight Sex Delivery Anes PTL Lv  1 Current              XBM:[id number] except as detailed in HPI.Marland Kitchen  No family history of birth defects. Soc Hx: Never smoker, Alcohol: none and  Recreational drug use: none  Objective:   LMP 05/30/2016  BP 145/84 @ 1301, 145/100 @ 1401, 135/95  Constitutional: Well nourished, well developed female in no acute distress.  HEENT: normal Skin: Warm and dry.  Cardiovascular:Regular rate and rhythm.   Extremity: trace to 1+ bilateral pedal edema Respiratory: Clear to auscultation bilateral. Normal respiratory effort Abdomen: non-tender Back: no CVAT Neuro: DTRs 2+, Cranial nerves grossly intact Psych: Alert and Oriented x3. No memory deficits. Normal mood and affect.  MS: normal gait, normal bilateral lower extremity ROM/strength/stability.  Vagina: within normal limits and with normal mucosa, no blood in the vault Cervix: 3/70/-2 to -1, between posterior and midline Uterus: occasional contractions  Adnexa: not evaluated  EFM:FHR: 135 bpm, variability: moderate,  accelerations:  Present,  decelerations:  Absent Toco: occasional contraction   Perinatal info:  Blood type: O positive Rubella - Immune Varicella - Immune 1 hour glucose: 142 on 8/7 3 hour glucose: all values wnl TDaP Given during third trimester of this pregnancy: 01/02/2017 RPR NR / HIV Neg/ HBsAg Neg  GBS negative on 9/24  Assessment & Plan:   26 y.o. G1P0 @ 103w0d, Admitted on 02/19/2017: with gestational hypertension for induction of labor at term. Proceed with pitocin given cervical exam  Admit for labor, Observe for cervical change, Fetal Wellbeing Reassuring, Epidural when ready, AROM when Appropriate and GBS status negative, treat as needed, IV start and fluids, Type and screen  Erskine Squibb  Higinio Roger CNM [facility], [facility] 02/19/2017  3:04 PM

## 2017-02-19 NOTE — Progress Notes (Signed)
Labor Progress Note   26 y.o. G1P0 @ [redacted]w[redacted]d , admitted for  Pregnancy, Labor Management. Induction of labor for GHTN  Subjective:  Patient has been sitting on the birth ball for 2 hours. She is feeling contractions are stronger and getting closer together- every 3 minutes per her report. She is requesting pain medicine.  Objective:  BP (!) 156/87   Pulse 64   Temp 98.1 F (36.7 C) (Oral)   Resp 18   Ht  (1.6 m)   Wt 216 lb (98 kg)   LMP 05/30/2016   BMI 38.26 kg/m  Abd: mild Extr: trace to 1+ bilateral pedal edema SVE: CERVIX: 4 cm dilated, 80 effaced, -1 station, still midway between posterior and midline  EFM: FHR: 160 bpm, variability: moderate,  accelerations:  Present,  decelerations:  Absent Toco: Frequency: Every 3-4 minutes Labs: I have reviewed the patient's lab results. Results for LEONTINE, RADMAN (MRN [id number]) as of 02/19/2017 19:49   Ref. Range 02/19/2017 14:13 02/19/2017 15:13 02/19/2017 17:30  COMPREHENSIVE METABOLIC PANEL Unknown Rpt (A)    Sodium Latest Ref Range: 135 - 145 mmol/L 136    Potassium Latest Ref Range: 3.5 - 5.1 mmol/L 3.7    Chloride Latest Ref Range: 101 - 111 mmol/L 106    CO2 Latest Ref Range: 22 - 32 mmol/L 22    Glucose Latest Ref Range: 65 - 99 mg/dL 409 (H)    BUN Latest Ref Range: 6 - 20 mg/dL 9    Creatinine Latest Ref Range: 0.44 - 1.00 mg/dL 8.11    Calcium Latest Ref Range: 8.9 - 10.3 mg/dL 8.6 (L)    Anion gap Latest Ref Range: 5 - 15  8    Alkaline Phosphatase Latest Ref Range: 38 - 126 U/L 146 (H)    Albumin Latest Ref Range: 3.5 - 5.0 g/dL 2.9 (L)    AST Latest Ref Range: 15 - 41 U/L 31    ALT Latest Ref Range: 14 - 54 U/L 14    Total Protein Latest Ref Range: 6.5 - 8.1 g/dL 6.8    Total Bilirubin Latest Ref Range: 0.3 - 1.2 mg/dL 0.4    GFR, Est Non African American Latest Ref Range: >60 mL/min >60    GFR, Est African American Latest Ref Range: >60 mL/min >60    WBC Latest Ref Range: 3.6 - 11.0 K/uL 10.7    RBC Latest  Ref Range: 3.80 - 5.20 MIL/uL 3.93    Hemoglobin Latest Ref Range: 12.0 - 16.0 g/dL 91.4 (L)    HCT Latest Ref Range: 35.0 - 47.0 % 33.1 (L)    MCV Latest Ref Range: 80.0 - 100.0 fL 84.4    MCH Latest Ref Range: 26.0 - 34.0 pg 28.5    MCHC Latest Ref Range: 32.0 - 36.0 g/dL 78.2    RDW Latest Ref Range: 11.5 - 14.5 % 14.9 (H)    Platelets Latest Ref Range: 150 - 440 K/uL 283    Sample Expiration Unknown 02/22/2017 02/22/2017   Antibody Screen Unknown PENDING NEG   ABO/RH(D) Unknown PENDING O POS   Total Protein, Urine Latest Units: mg/dL   <6  Protein Creatinine Ratio Latest Ref Range: 0.00 - 0.15 mg/mgCre   Pend  Creatinine, Urine Latest Units: mg/dL   44     Assessment & Plan:  G1P0 @ [redacted]w[redacted]d, admitted for  Pregnancy and Labor/Delivery Management  1. Pain management: IV analgesia. 2. FWB: FHT category I.  3. ID: GBS negative 4.  Labor management: continue pitocin, consider AROM at next cervical exam  All discussed with patient, see orders  Tresea Mall, CNM  [facility], [facility] 02/19/2017  7:46 PM

## 2017-02-20 ENCOUNTER — Encounter
Admission: RE | Disposition: A | Discharge status: Home / Self Care | Payer: Self-pay | Source: Ambulatory Visit | Attending: Obstetrics & Gynecology

## 2017-02-20 DIAGNOSIS — Z3A38 38 weeks gestation of pregnancy: Secondary | ICD-10-CM

## 2017-02-20 DIAGNOSIS — O133 Gestational [pregnancy-induced] hypertension without significant proteinuria, third trimester: Secondary | ICD-10-CM

## 2017-02-20 SURGERY — Surgical Case
Anesthesia: Epidural | Site: Abdomen | Wound class: Clean Contaminated

## 2017-02-20 MED ORDER — BUPIVACAINE HCL (PF) 0.5 % IJ SOLN
INTRAMUSCULAR | Status: AC
  Filled 2017-02-20: qty 30

## 2017-02-20 MED ORDER — PRENATAL MULTIVITAMIN CH
1.0000 | ORAL_TABLET | Freq: Every day | ORAL | Status: DC
  Administered 2017-02-21 – 2017-02-23 (×3): 1 via ORAL
  Filled 2017-02-20 (×3): qty 1

## 2017-02-20 MED ORDER — FENTANYL CITRATE (PF) 100 MCG/2ML IJ SOLN
25.0000 ug | INTRAMUSCULAR | Status: AC | PRN
  Administered 2017-02-20 (×6): 25 ug via INTRAVENOUS

## 2017-02-20 MED ORDER — OXYCODONE-ACETAMINOPHEN 5-325 MG PO TABS
1.0000 | ORAL_TABLET | ORAL | Status: DC | PRN
  Administered 2017-02-20 – 2017-02-23 (×9): 1 via ORAL
  Filled 2017-02-20 (×9): qty 1

## 2017-02-20 MED ORDER — SIMETHICONE 80 MG PO CHEW
80.0000 mg | CHEWABLE_TABLET | Freq: Three times a day (TID) | ORAL | Status: DC
  Administered 2017-02-20 – 2017-02-23 (×9): 80 mg via ORAL
  Filled 2017-02-20 (×9): qty 1

## 2017-02-20 MED ORDER — ACETAMINOPHEN 325 MG PO TABS: 650.0000 mg | ORAL_TABLET | ORAL | Status: DC | PRN

## 2017-02-20 MED ORDER — LACTATED RINGERS IV SOLN
INTRAVENOUS | Status: DC
  Administered 2017-02-20: 22:00:00 via INTRAVENOUS

## 2017-02-20 MED ORDER — DIPHENHYDRAMINE HCL 50 MG/ML IJ SOLN: 12.5000 mg | INTRAMUSCULAR | Status: DC | PRN

## 2017-02-20 MED ORDER — OXYTOCIN 40 UNITS IN LACTATED RINGERS INFUSION - SIMPLE MED
2.5000 [IU]/h | INTRAVENOUS | Status: DC
  Filled 2017-02-20: qty 1000

## 2017-02-20 MED ORDER — SOD CITRATE-CITRIC ACID 500-334 MG/5ML PO SOLN
30.0000 mL | ORAL | Status: AC
  Administered 2017-02-20: 30 mL via ORAL

## 2017-02-20 MED ORDER — CEFOXITIN SODIUM-DEXTROSE 2-2.2 GM-% IV SOLR (PREMIX)
2.0000 g | INTRAVENOUS | Status: AC
  Administered 2017-02-20: 2 g via INTRAVENOUS
  Administered 2017-02-20: 2000 mg via INTRAVENOUS

## 2017-02-20 MED ORDER — CHLOROPROCAINE HCL (PF) 3 % IJ SOLN
INTRAMUSCULAR | Status: DC | PRN
  Administered 2017-02-20: 5 mL via EPIDURAL
  Administered 2017-02-20: 5 mL
  Administered 2017-02-20 (×2): 5 mL via EPIDURAL

## 2017-02-20 MED ORDER — SODIUM CHLORIDE 0.9% FLUSH: 3.0000 mL | INTRAVENOUS | Status: DC | PRN

## 2017-02-20 MED ORDER — FENTANYL CITRATE (PF) 250 MCG/5ML IJ SOLN
INTRAMUSCULAR | Status: AC
  Filled 2017-02-20: qty 5

## 2017-02-20 MED ORDER — FENTANYL CITRATE (PF) 100 MCG/2ML IJ SOLN
INTRAMUSCULAR | Status: DC | PRN
  Administered 2017-02-20: 50 ug via INTRAVENOUS

## 2017-02-20 MED ORDER — KETAMINE HCL 50 MG/ML IJ SOLN
INTRAMUSCULAR | Status: AC
  Filled 2017-02-20: qty 10

## 2017-02-20 MED ORDER — LABETALOL HCL 5 MG/ML IV SOLN
INTRAVENOUS | Status: AC
  Administered 2017-02-20: 10 mg via INTRAVENOUS
  Filled 2017-02-20: qty 4

## 2017-02-20 MED ORDER — OXYCODONE-ACETAMINOPHEN 5-325 MG PO TABS
2.0000 | ORAL_TABLET | ORAL | Status: DC | PRN
  Administered 2017-02-21 – 2017-02-22 (×3): 2 via ORAL
  Filled 2017-02-20 (×3): qty 2

## 2017-02-20 MED ORDER — SUCCINYLCHOLINE CHLORIDE 20 MG/ML IJ SOLN
INTRAMUSCULAR | Status: DC | PRN
  Administered 2017-02-20: 100 mg via INTRAVENOUS

## 2017-02-20 MED ORDER — MIDAZOLAM HCL 2 MG/2ML IJ SOLN
INTRAMUSCULAR | Status: DC | PRN
Start: 2017-02-20 — End: 2017-02-20
  Administered 2017-02-20: 2 mg via INTRAVENOUS

## 2017-02-20 MED ORDER — WITCH HAZEL-GLYCERIN EX PADS: 1.0000 "application " | MEDICATED_PAD | CUTANEOUS | Status: DC | PRN

## 2017-02-20 MED ORDER — KETOROLAC TROMETHAMINE 30 MG/ML IJ SOLN: 30.0000 mg | Freq: Four times a day (QID) | INTRAMUSCULAR | Status: DC | PRN

## 2017-02-20 MED ORDER — OXYTOCIN 40 UNITS IN LACTATED RINGERS INFUSION - SIMPLE MED
INTRAVENOUS | Status: AC
  Filled 2017-02-20: qty 1000

## 2017-02-20 MED ORDER — CEFOXITIN SODIUM-DEXTROSE 2-2.2 GM-% IV SOLR (PREMIX)
INTRAVENOUS | Status: AC
  Administered 2017-02-20: 2000 mg via INTRAVENOUS
  Filled 2017-02-20: qty 50

## 2017-02-20 MED ORDER — NALBUPHINE HCL 10 MG/ML IJ SOLN: 5.0000 mg | INTRAMUSCULAR | Status: DC | PRN

## 2017-02-20 MED ORDER — LACTATED RINGERS IV SOLN: INTRAVENOUS | Status: DC

## 2017-02-20 MED ORDER — LABETALOL HCL 5 MG/ML IV SOLN
10.0000 mg | INTRAVENOUS | Status: DC | PRN
  Filled 2017-02-20: qty 4

## 2017-02-20 MED ORDER — BUPIVACAINE HCL 0.5 % IJ SOLN
10.0000 mL | Freq: Once | INTRAMUSCULAR | Status: DC
  Filled 2017-02-20: qty 10

## 2017-02-20 MED ORDER — KETOROLAC TROMETHAMINE 30 MG/ML IJ SOLN
INTRAMUSCULAR | Status: AC
  Administered 2017-02-20: 30 mg via INTRAVENOUS
  Filled 2017-02-20: qty 1

## 2017-02-20 MED ORDER — DIPHENHYDRAMINE HCL 25 MG PO CAPS: 25.0000 mg | ORAL_CAPSULE | ORAL | Status: DC | PRN

## 2017-02-20 MED ORDER — KETOROLAC TROMETHAMINE 30 MG/ML IJ SOLN
30.0000 mg | Freq: Four times a day (QID) | INTRAMUSCULAR | Status: AC
  Administered 2017-02-20 – 2017-02-21 (×4): 30 mg via INTRAVENOUS
  Filled 2017-02-20 (×3): qty 1

## 2017-02-20 MED ORDER — CHLOROPROCAINE HCL (PF) 3 % IJ SOLN
INTRAMUSCULAR | Status: AC
  Filled 2017-02-20: qty 20

## 2017-02-20 MED ORDER — DIPHENHYDRAMINE HCL 25 MG PO CAPS: 25.0000 mg | ORAL_CAPSULE | Freq: Four times a day (QID) | ORAL | Status: DC | PRN

## 2017-02-20 MED ORDER — BUPIVACAINE HCL 0.25 % IJ SOLN
INTRAMUSCULAR | Status: DC | PRN
  Administered 2017-02-20 (×2): 10 mL

## 2017-02-20 MED ORDER — ONDANSETRON HCL 4 MG/2ML IJ SOLN: 4.0000 mg | Freq: Three times a day (TID) | INTRAMUSCULAR | Status: DC | PRN

## 2017-02-20 MED ORDER — ONDANSETRON HCL 4 MG/2ML IJ SOLN: 4.0000 mg | Freq: Once | INTRAMUSCULAR | Status: DC | PRN

## 2017-02-20 MED ORDER — SIMETHICONE 80 MG PO CHEW
80.0000 mg | CHEWABLE_TABLET | ORAL | Status: DC
  Filled 2017-02-20: qty 1

## 2017-02-20 MED ORDER — PROPOFOL 10 MG/ML IV BOLUS
INTRAVENOUS | Status: DC | PRN
  Administered 2017-02-20: 130 mg via INTRAVENOUS

## 2017-02-20 MED ORDER — ONDANSETRON HCL 4 MG/2ML IJ SOLN
INTRAMUSCULAR | Status: AC
  Filled 2017-02-20: qty 2

## 2017-02-20 MED ORDER — MIDAZOLAM HCL 2 MG/2ML IJ SOLN
INTRAMUSCULAR | Status: AC
  Filled 2017-02-20: qty 2

## 2017-02-20 MED ORDER — LABETALOL HCL 5 MG/ML IV SOLN
10.0000 mg | Freq: Once | INTRAVENOUS | Status: AC
  Administered 2017-02-20: 10 mg via INTRAVENOUS

## 2017-02-20 MED ORDER — NALOXONE HCL 0.4 MG/ML IJ SOLN: 0.4000 mg | INTRAMUSCULAR | Status: DC | PRN

## 2017-02-20 MED ORDER — BUPIVACAINE 0.25 % ON-Q PUMP DUAL CATH 400 ML
400.0000 mL | INJECTION | Status: DC
  Filled 2017-02-20: qty 400

## 2017-02-20 MED ORDER — MENTHOL 3 MG MT LOZG
1.0000 | LOZENGE | OROMUCOSAL | Status: DC | PRN
  Filled 2017-02-20: qty 9

## 2017-02-20 MED ORDER — ACETAMINOPHEN 500 MG PO TABS
1000.0000 mg | ORAL_TABLET | Freq: Four times a day (QID) | ORAL | Status: DC
  Filled 2017-02-20: qty 2

## 2017-02-20 MED ORDER — FENTANYL CITRATE (PF) 100 MCG/2ML IJ SOLN
INTRAMUSCULAR | Status: AC
  Administered 2017-02-20: 25 ug via INTRAVENOUS
  Filled 2017-02-20: qty 2

## 2017-02-20 MED ORDER — ZOLPIDEM TARTRATE 5 MG PO TABS
5.0000 mg | ORAL_TABLET | Freq: Every evening | ORAL | Status: DC | PRN
Start: 2017-02-20 — End: 2017-02-23

## 2017-02-20 MED ORDER — MEPERIDINE HCL 50 MG/ML IJ SOLN: 6.2500 mg | INTRAMUSCULAR | Status: DC | PRN

## 2017-02-20 MED ORDER — COCONUT OIL OIL
1.0000 "application " | TOPICAL_OIL | Status: DC | PRN
  Administered 2017-02-23: 1 via TOPICAL
  Filled 2017-02-20: qty 120

## 2017-02-20 MED ORDER — PROPOFOL 10 MG/ML IV BOLUS
INTRAVENOUS | Status: AC
  Filled 2017-02-20: qty 20

## 2017-02-20 MED ORDER — SENNOSIDES-DOCUSATE SODIUM 8.6-50 MG PO TABS
2.0000 | ORAL_TABLET | ORAL | Status: DC
  Administered 2017-02-21 – 2017-02-23 (×3): 2 via ORAL
  Filled 2017-02-20 (×3): qty 2

## 2017-02-20 MED ORDER — DIBUCAINE 1 % RE OINT: 1.0000 "application " | TOPICAL_OINTMENT | RECTAL | Status: DC | PRN

## 2017-02-20 MED ORDER — SOD CITRATE-CITRIC ACID 500-334 MG/5ML PO SOLN
ORAL | Status: AC
  Administered 2017-02-20: 30 mL via ORAL
  Filled 2017-02-20: qty 15

## 2017-02-20 MED ORDER — SIMETHICONE 80 MG PO CHEW
80.0000 mg | CHEWABLE_TABLET | ORAL | Status: DC | PRN
  Administered 2017-02-20: 80 mg via ORAL
  Filled 2017-02-20: qty 1

## 2017-02-20 MED ORDER — MORPHINE SULFATE (PF) 2 MG/ML IV SOLN: 1.0000 mg | INTRAVENOUS | Status: DC | PRN

## 2017-02-20 SURGICAL SUPPLY — 24 items
CANISTER SUCT 3000ML PPV (MISCELLANEOUS) ×3 IMPLANT
CATH KIT ON-Q SILVERSOAK 5IN (CATHETERS) ×6 IMPLANT
CHLORAPREP W/TINT 26ML (MISCELLANEOUS) ×6 IMPLANT
DERMABOND ADVANCED (GAUZE/BANDAGES/DRESSINGS) ×2
DERMABOND ADVANCED .7 DNX12 (GAUZE/BANDAGES/DRESSINGS) ×1 IMPLANT
DRSG OPSITE POSTOP 4X10 (GAUZE/BANDAGES/DRESSINGS) ×3 IMPLANT
ELECT CAUTERY BLADE 6.4 (BLADE) ×3 IMPLANT
ELECT REM PT RETURN 9FT ADLT (ELECTROSURGICAL) ×3
ELECTRODE REM PT RTRN 9FT ADLT (ELECTROSURGICAL) ×1 IMPLANT
GLOVE SKINSENSE NS SZ8.0 LF (GLOVE) ×2
GLOVE SKINSENSE STRL SZ8.0 LF (GLOVE) ×1 IMPLANT
GOWN STRL REUS W/ TWL LRG LVL3 (GOWN DISPOSABLE) ×1 IMPLANT
GOWN STRL REUS W/ TWL XL LVL3 (GOWN DISPOSABLE) ×2 IMPLANT
GOWN STRL REUS W/TWL LRG LVL3 (GOWN DISPOSABLE) ×2
GOWN STRL REUS W/TWL XL LVL3 (GOWN DISPOSABLE) ×4
NEEDLE HYPO 22GX1.5 SAFETY (NEEDLE) ×3 IMPLANT
NS IRRIG 1000ML POUR BTL (IV SOLUTION) ×3 IMPLANT
PACK C SECTION AR (MISCELLANEOUS) ×3 IMPLANT
PAD OB MATERNITY 4.3X12.25 (PERSONAL CARE ITEMS) ×3 IMPLANT
PAD PREP 24X41 OB/GYN DISP (PERSONAL CARE ITEMS) ×3 IMPLANT
SUT MAXON ABS #0 GS21 30IN (SUTURE) ×6 IMPLANT
SUT VIC AB 1 CT1 36 (SUTURE) ×9 IMPLANT
SUT VIC AB 2-0 CT1 36 (SUTURE) ×3 IMPLANT
SUT VIC AB 4-0 FS2 27 (SUTURE) ×3 IMPLANT

## 2017-02-20 NOTE — Anesthesia Post-op Follow-up Note (Signed)
Anesthesia QCDR form completed.        

## 2017-02-20 NOTE — Progress Notes (Signed)
  Labor Progress Note   26 y.o. G1P0 @ [redacted]w[redacted]d, admitted for IOL for gestational HTN.  Subjective:  Pain control is adequate with epidural. Resting on her side.  Objective:  BP 136/82   Pulse 80   Temp 97.6 F (36.4 C) (Oral)   Resp 18   Ht  (1.6 m)   Wt 216 lb (98 kg)   LMP 05/30/2016   SpO2 96%   BMI 38.26 kg/m  Abd: mild to moderate Extr: trace to 1+ bilateral pedal edema SVE: exam at IUPC placement 5/90/0; unchanged since around 12 am  EFM: FHR: 130 bpm, variability: moderate,  accelerations:  Present,  decelerations:  Present early and variable, some deep and prolonged Toco: Frequency: Every 2.5-5 minutes, Duration: 60-80 seconds and Intensity: moderate Labs: I have reviewed the patient's lab results.   Assessment & Plan:  G1P0 @ [redacted]w[redacted]d, admitted for  Pregnancy and Labor/Delivery Management  1. Pain management: epidural. 2. FWB: FHT category II; notified MD of recurrent variables with concerning features.  3. ID: GBS negative 4. Labor management: MD to come and assess patient. Labor not progressing and unable to titrate Pitocin upward due to fetal heart rate pattern.  All discussed with patient, see orders  Marcelyn Bruins, CNM 02/20/2017  11:05 AM

## 2017-02-20 NOTE — Progress Notes (Signed)
  Labor Progress Note   26 y.o. G1P0 @ [redacted]w[redacted]d , admitted for  Pregnancy, Labor Management.   Subjective:  Min pain. Ctxs reg on Pitocin Concern for slow/lack of cervical change and more frequent var/early decels  Objective:  BP 136/82   Pulse 80   Temp 97.6 F (36.4 C) (Oral)   Resp 18   Ht  (1.6 m)   Wt 216 lb (98 kg)   LMP 05/30/2016   SpO2 96%   BMI 38.26 kg/m  Abd: mild Extr: trace to 1+ bilateral pedal edema SVE: CERVIX: unchanged  (5 cm)  EFM: FHR: 120 bpm, variability: moderate,  accelerations:  Present,  decelerations:  Present Decel w each Ctx c/w early decel; may be var since only 5 cm; good recovery each time Toco: Frequency: Every 4-5 minutes Labs: I have reviewed the patient's lab results.   Assessment & Plan:  G1P0 @ [redacted]w[redacted]d, admitted for  Pregnancy and Labor/Delivery Management  1. Pain management: epidural. 2. FWB: FHT category 2.  3. ID: GBS negative 4. Labor management: No change in cervix and FHR concerns w every ctx  The risks of cesarean section discussed with the patient included but were not limited to: bleeding which may require transfusion or reoperation; infection which may require antibiotics; injury to bowel, bladder, ureters or other surrounding organs; injury to the fetus; need for additional procedures including hysterectomy in the event of a life-threatening hemorrhage; placental abnormalities wth subsequent pregnancies, incisional problems, thromboembolic phenomenon and other postoperative/anesthesia complications. The patient concurred with the proposed plan, giving informed written consent for the procedure.   All discussed with patient, see orders  Annamarie Major, MD, [facility], [facility] 02/20/2017  10:58 AM

## 2017-02-20 NOTE — Progress Notes (Signed)
  Labor Progress Note   26 y.o. G1P0 @ [redacted]w[redacted]d , admitted for  Pregnancy, Labor Management. IOL for GHTN  Subjective:  Comfortable with epidural  Objective:  BP (!) 133/96   Pulse 77   Temp 98.1 F (36.7 C) (Oral)   Resp 18   Ht  (1.6 m)   Wt 216 lb (98 kg)   LMP 05/30/2016   SpO2 96%   BMI 38.26 kg/m  Abd: mild Extr: trace to 1+ bilateral pedal edema SVE: CERVIX: 5 cm dilated, 90 effaced, -1 station AROM: light meconium  EFM: FHR: 130 bpm, variability: moderate,  accelerations:  Present,  decelerations:  Absent Toco: Frequency: Every 2-5 minutes Labs: I have reviewed the patient's lab results.   Assessment & Plan:  G1P0 @ [redacted]w[redacted]d, admitted for  Pregnancy and Labor/Delivery Management  1. Pain management: epidural. 2. FWB: FHT category I.  3. ID: GBS negative 4. Labor management: AROM, continue pitocin  All discussed with patient, see orders  Tresea Mall, CNM [facility], [facility] 02/20/2017  6:23 AM

## 2017-02-20 NOTE — Discharge Summary (Signed)
OB Discharge Summary     Patient Name: Rebecca Gomez DOB: May 11, 1991 MRN: [id number]  Date of admission: 02/19/2017 Delivering MD: Farrel Conners, CNM  Date of Delivery: 02/19/2017  Date of discharge: 02/23/2017  Admitting diagnosis: induction Intrauterine pregnancy: [redacted]w[redacted]d     Secondary diagnosis: Gestational Hypertension     Discharge diagnosis: Term Pregnancy Delivered and Cesarean Delivery, Failed induction of labor and Gestational hypertension  . Postpartum preeclmpsia                       Hospital course:  Onset of Labor With Unplanned C/S  26 y.o. yo G1P1001 at [redacted]w[redacted]d was admitted for IOL due to Gest HTN on 02/19/2017. Patient had a labor course significant for Pitocin and AROM. Membrane Rupture Time/Date: 6:15 AM ,02/20/2017   The patient went for cesarean section due to Arrest of Dilation and Fetal Intolerence to Labor, and delivered a Viable femlae infant,02/20/2017  Details of operation can be found in separate operative note. Patient had a postpartum course complicated by worsening blood pressures and proteinuria and was diagnosed with preeclampsia. She was started on Procardia 30 mgm XL daily and was discharge home with blood pressures in the normal to mild range.  She is ambulating,tolerating a regular diet, passing flatus, and urinating well.  Patient is discharged home in stable condition 02/23/2017.                                                                 Post partum procedures:none  Complications: postpartum preeclampsia  Physical exam on 02/23/2017: Vitals:   02/22/17 2057 02/22/17 2353 02/23/17 0521 02/23/17 0736  BP: (!) 149/99 (!) 151/95 (!) 151/97 (!) 143/85  Pulse: 91 82 79 95  Resp: Temp:  97.6 F (36.4 C) 97.7 F (36.5 C) 98 F (36.7 C)  TempSrc:  Oral Oral Oral  SpO2: 97% 100% 100% 98%  Weight:      Height:       General: alert, cooperative and no distress. Denies headache, visual changes, RUQ pain Heart: RRR with a Grade II/VI  systolic murmur heard best at the pulmonic area Lungs: CTA Lochia: appropriate Uterine Fundus: firm/ U-2/ ML/ NT Abdomen: bowel sounds present, non distended. Incision: C+D+I honey comb dressing. ON Q intact DVT Evaluation: No evidence of DVT seen on physical exam.  Labs: Lab Results  Component Value Date   WBC 14.0 (H) 02/22/2017   HGB 10.0 (L) 02/22/2017   HCT 29.7 (L) 02/22/2017   MCV 83.8 02/22/2017   PLT 277 02/22/2017   CMP Latest Ref Rng & Units 02/22/2017  Glucose 65 - 99 mg/dL 76  BUN 6 - 20 mg/dL 5(L)  Creatinine 8.29 - 1.00 mg/dL 5.62  Sodium 130 - 865 mmol/L 138  Potassium 3.5 - 5.1 mmol/L 3.6  Chloride 101 - 111 mmol/L 107  CO2 22 - 32 mmol/L 26  Calcium 8.9 - 10.3 mg/dL 8.3(L)  Total Protein 6.5 - 8.1 g/dL 6.0(L)  Total Bilirubin 0.3 - 1.2 mg/dL 0.4  Alkaline Phos 38 - 126 U/L 114  AST 15 - 41 U/L 31  ALT 14 - 54 U/L 17    Discharge instruction: per After Visit Summary.  Medications:  Allergies as of 02/23/2017  No Known Allergies     Medication List    TAKE these medications   ibuprofen 600 MG tablet Commonly known as:  ADVIL,MOTRIN Take 1 tablet (600 mg total) by mouth every 6 (six) hours as needed for headache, mild pain, moderate pain or cramping.   multivitamin-prenatal 27-0.8 MG Tabs tablet Take 1 tablet by mouth daily at 12 noon.   NIFEdipine 30 MG 24 hr tablet Commonly known as:  PROCARDIA-XL/ADALAT CC Take 1 tablet (30 mg total) by mouth daily.   oxyCODONE-acetaminophen 5-325 MG tablet Commonly known as:  PERCOCET/ROXICET Take 1-2 tablets by mouth every 6 (six) hours as needed (pain scale 4-7).       Diet: routine diet  Activity: Advance as tolerated. Pelvic rest for 6 weeks.   Outpatient follow up: Follow-up Information    Nadara Mustard, MD. Schedule an appointment as soon as possible for a visit on 03/06/2017.   Specialty:  Obstetrics and Gynecology Why:  for blood pressure check and incision check. Contact  information: [address] [place] [address] [phone]             Postpartum contraception: Undecided Rhogam Given postpartum: no Rubella vaccine given postpartum: no Varicella vaccine given postpartum: no TDaP given antepartum or postpartum: Yes  Newborn Data: Live born female / Sheran Lawless Birth Weight: 7 lb 4.4 oz (3300 g) APGAR: 8, 9  Newborn Delivery   Birth date/time:  02/20/2017 11:45:00 Delivery type:  C-Section, Low Transverse  C-section categorization:  Primary      Baby Feeding: Breast  Disposition:home with mother  SIGNED:  Farrel Conners, CNM 02/23/2017 9:42 AM

## 2017-02-20 NOTE — Transfer of Care (Signed)
Immediate Anesthesia Transfer of Care Note  Patient: Rebecca Gomez  Procedure(s) Performed: CESAREAN SECTION (N/A Abdomen)  Patient Location: PACU  Anesthesia Type:General  Level of Consciousness: drowsy and patient cooperative  Airway & Oxygen Therapy: Patient Spontanous Breathing and Patient connected to nasal cannula oxygen  Post-op Assessment: Report given to RN and Post -op Vital signs reviewed and stable  Post vital signs: Reviewed and stable  Last Vitals:  Vitals:   02/20/17 0722 02/20/17 1230  BP: 136/82 (!) 110/91  Pulse: 80 (!) 120  Resp:  (!) 23  Temp: 36.4 C (!) 36.2 C  SpO2:  98%    Last Pain:  Vitals:   02/20/17 1230  TempSrc:   PainSc: 9          Complications: No apparent anesthesia complications

## 2017-02-20 NOTE — Op Note (Signed)
Cesarean Section Procedure Note Indications: failure to progress: arrest of dilation, Gest HTN, Fetal Intolerence to Labor and term intrauterine pregnancy  Pre-operative Diagnosis: Intrauterine pregnancy [redacted]w[redacted]d ;  failure to progress: arrest of dilation, Gest HTN, FITL and term intrauterine pregnancy Post-operative Diagnosis: same, delivered. Procedure: Low Transverse Cesarean Section Surgeon: Annamarie Major, MD, FACOG Assistant(s): Midwife [facility] Anesthesia: General endotracheal anesthesia Estimated Blood Loss:500 Complications: None; patient tolerated the procedure well. Disposition: PACU - hemodynamically stable. Condition: stable  Findings: A female infant in the cephalic presentation. Amniotic fluid - Meconium  Birth weight 7-4 lbs.  Apgars of 8 and 9.  Intact placenta with a three-vessel cord. Grossly normal uterus, tubes and ovaries bilaterally. No intraabdominal adhesions were noted.  Procedure Details   The patient was taken to Operating Room, identified as the correct patient and the procedure verified as C-Section Delivery. A Time Out was held and the above information confirmed. After induction of anesthesia, the patient was draped and prepped in the usual sterile manner. A Pfannenstiel incision was made and carried down through the subcutaneous tissue to the fascia. Fascial incision was made and extended transversely with the Mayo scissors. The fascia was separated from the underlying rectus tissue superiorly and inferiorly. The peritoneum was identified and entered bluntly. Peritoneal incision was extended longitudinally. The utero-vesical peritoneal reflection was incised transversely and a bladder flap was created digitally.  A low transverse hysterotomy was made. The fetus was delivered atraumatically. The umbilical cord was clamped x2 and cut and the infant was handed to the awaiting pediatricians. The placenta was removed intact and appeared normal with a 3-vessel cord.  The  uterus was exteriorized and cleared of all clot and debris. The hysterotomy was closed with running sutures of 0 Vicryl suture. A second imbricating layer was placed with the same suture. Excellent hemostasis was observed. The uterus was returned to the abdomen. The pelvis was irrigated and again, excellent hemostasis was noted.  The On Q Pain pump System was then placed.  Trocars were placed through the abdominal wall into the subfascial space and these were used to thread the silver soaker cathaters into place.The rectus fascia was then reapproximated with running sutures of Maxon, with careful placement not to incorporate the cathaters. Subcutaneous tissues are then irrigated with saline and hemostasis assured.  Skin is then closed with 4-0 vicryl suture in a subcuticular fashion followed by skin adhesive. The cathaters are flushed each with 5 mL of Bupivicaine and stabilized into place with dressing. Instrument, sponge, and needle counts were correct prior to the abdominal closure and at the conclusion of the case.  The patient tolerated the procedure well and was transferred to the recovery room in stable condition.   Annamarie Major, MD, [facility], [facility] 02/20/2017  12:17 PM

## 2017-02-20 NOTE — Progress Notes (Signed)
  Labor Progress Note   26 y.o. G1P0 @ [redacted]w[redacted]d , admitted for  Pregnancy, Labor Management. IOL for GHTN  Subjective:  Feels some pressure with contractions  Objective:  BP 136/82   Pulse 80   Temp 97.6 F (36.4 C) (Oral)   Resp 18   Ht  (1.6 m)   Wt 216 lb (98 kg)   LMP 05/30/2016   SpO2 96%   BMI 38.26 kg/m  Abd: mild Extr: trace to 1+ bilateral pedal edema SVE: CERVIX: 5.5 cm dilated, 90 effaced, 0 to +1 station   EFM: FHR: 130 bpm, variability: moderate,  accelerations:  Present,  decelerations:  Early decelerations with each contraction Toco: Frequency: Every 2-5 minutes Labs: I have reviewed the patient's lab results.   Assessment & Plan:  G1P0 @ [redacted]w[redacted]d, admitted for  Pregnancy and Labor/Delivery Management  1. Pain management: epidural. 2. FWB: FHT category II  3. ID: GBS negative 4. Labor management: continue pitocin, position changes, peanut ball  All discussed with patient, see orders  Tresea Mall, CNM [facility], [facility] 02/20/2017  7:42 AM

## 2017-02-20 NOTE — Anesthesia Procedure Notes (Signed)
Procedure Name: Intubation Date/Time: 02/20/2017 11:42 AM Performed by: Jonna Clark Pre-anesthesia Checklist: Patient identified, Patient being monitored, Timeout performed, Emergency Drugs available and Suction available Patient Re-evaluated:Patient Re-evaluated prior to induction Oxygen Delivery Method: Circle system utilized Preoxygenation: Pre-oxygenation with 100% oxygen Induction Type: IV induction, Rapid sequence and Cricoid Pressure applied Ventilation: Mask ventilation without difficulty Laryngoscope Size: Mac and 3 Grade View: Grade I Tube type: Oral Tube size: 7.0 mm Number of attempts: 1 Airway Equipment and Method: Stylet Placement Confirmation: ETT inserted through vocal cords under direct vision,  positive ETCO2 and breath sounds checked- equal and bilateral Secured at: 21 cm Tube secured with: Tape Dental Injury: Teeth and Oropharynx as per pre-operative assessment

## 2017-02-21 LAB — CBC
HCT: 26.4 % — ABNORMAL LOW (ref 35.0–47.0)
HEMOGLOBIN: 8.8 g/dL — AB (ref 12.0–16.0)
MCH: 27.9 pg (ref 26.0–34.0)
MCHC: 33.4 g/dL (ref 32.0–36.0)
MCV: 83.4 fL (ref 80.0–100.0)
PLATELETS: 235 10*3/uL (ref 150–440)
RBC: 3.16 MIL/uL — ABNORMAL LOW (ref 3.80–5.20)
RDW: 15.5 % — ABNORMAL HIGH (ref 11.5–14.5)
WBC: 13.4 10*3/uL — ABNORMAL HIGH (ref 3.6–11.0)

## 2017-02-21 MED ORDER — IBUPROFEN 400 MG PO TABS
600.0000 mg | ORAL_TABLET | Freq: Four times a day (QID) | ORAL | Status: DC
  Administered 2017-02-21 – 2017-02-23 (×9): 600 mg via ORAL
  Filled 2017-02-21 (×9): qty 1

## 2017-02-21 MED ORDER — FERROUS FUMARATE 324 (106 FE) MG PO TABS
1.0000 | ORAL_TABLET | Freq: Every day | ORAL | Status: DC
  Administered 2017-02-21 – 2017-02-23 (×3): 106 mg via ORAL
  Filled 2017-02-21 (×3): qty 1

## 2017-02-21 NOTE — Progress Notes (Signed)
POD #1 s/p LTCS for FTP/ FITL. IOL for Gestational hypertension Subjective:   Feels sore all over. Sitting up breast feeding baby. Tolerating regular diet. No nausea, headaches. Incisional pain relieved with percocet.  Objective:  Blood pressure (!) 151/96, pulse 71, temperature 98.4 F (36.9 C), temperature source Oral, resp. rate 18, height '5\' 3"'  (1.6 m), weight 98 kg (216 lb), last menstrual period 05/30/2016, SpO2 97 %, unknown if currently breastfeeding. Postpartum blood pressure range 140/79-151/96 Urine output 7200 ml. Foley draining clear straw yellow urine. General: NAD Heart: RRR without murmur Pulmonary: no increased work of breathing/ CTAB Abdomen: Honeycomb dressing C+D+I, ON-Q intact Extremities: mild pedal edema, edema, no erythema, no tenderness  Results for orders placed or performed during the hospital encounter of 02/19/17 (from the past 72 hour(s))  CBC     Status: Abnormal   Collection Time: 02/19/17  2:13 PM  Result Value Ref Range   WBC 10.7 3.6 - 11.0 K/uL   RBC 3.93 3.80 - 5.20 MIL/uL   Hemoglobin 11.2 (L) 12.0 - 16.0 g/dL   HCT 33.1 (L) 35.0 - 47.0 %   MCV 84.4 80.0 - 100.0 fL   MCH 28.5 26.0 - 34.0 pg   MCHC 33.8 32.0 - 36.0 g/dL   RDW 14.9 (H) 11.5 - 14.5 %   Platelets 283 150 - 440 K/uL  Comprehensive metabolic panel     Status: Abnormal   Collection Time: 02/19/17  2:13 PM  Result Value Ref Range   Sodium 136 135 - 145 mmol/L   Potassium 3.7 3.5 - 5.1 mmol/L    Comment: HEMOLYSIS AT THIS LEVEL MAY AFFECT RESULT   Chloride 106 101 - 111 mmol/L   CO2 22 22 - 32 mmol/L   Glucose, Bld 117 (H) 65 - 99 mg/dL   BUN 9 6 - 20 mg/dL   Creatinine, Ser 0.75 0.44 - 1.00 mg/dL   Calcium 8.6 (L) 8.9 - 10.3 mg/dL   Total Protein 6.8 6.5 - 8.1 g/dL   Albumin 2.9 (L) 3.5 - 5.0 g/dL   AST 31 15 - 41 U/L   ALT 14 14 - 54 U/L   Alkaline Phosphatase 146 (H) 38 - 126 U/L   Total Bilirubin 0.4 0.3 - 1.2 mg/dL   GFR calc non Af Amer >60 >60 mL/min   GFR calc Af  Amer >60 >60 mL/min    Comment: (NOTE) The eGFR has been calculated using the CKD EPI equation. This calculation has not been validated in all clinical situations. eGFR's persistently <60 mL/min signify possible Chronic Kidney Disease.    Anion gap 8 5 - 15  Type and screen     Status: None   Collection Time: 02/19/17  3:13 PM  Result Value Ref Range   ABO/RH(D) O POS    Antibody Screen NEG    Sample Expiration 02/22/2017   Protein / creatinine ratio, urine     Status: None   Collection Time: 02/19/17  5:30 PM  Result Value Ref Range   Creatinine, Urine 44 mg/dL   Total Protein, Urine <6 mg/dL    Comment: NO NORMAL RANGE ESTABLISHED FOR THIS TEST   Protein Creatinine Ratio        0.00 - 0.15 mg/mg[Cre]    Comment: RESULT BELOW REPORTABLE RANGE, UNABLE TO CALCULATE.   CBC     Status: Abnormal   Collection Time: 02/21/17  4:36 AM  Result Value Ref Range   WBC 13.4 (H) 3.6 - 11.0 K/uL  RBC 3.16 (L) 3.80 - 5.20 MIL/uL   Hemoglobin 8.8 (L) 12.0 - 16.0 g/dL    Comment: RESULT REPEATED AND VERIFIED   HCT 26.4 (L) 35.0 - 47.0 %   MCV 83.4 80.0 - 100.0 fL   MCH 27.9 26.0 - 34.0 pg   MCHC 33.4 32.0 - 36.0 g/dL   RDW 15.5 (H) 11.5 - 14.5 %   Platelets 235 150 - 440 K/uL     Assessment:   26 y.o. G1P1001 postoperativeday # 1-stable  Continue postoperative/ postpartum care  DC foley later this AM  OOB with assist Gestational hypertension-mild range blood pressures. Will monitor. If remins elevated-will start antihypertensive meds  Plan:  1) Acute blood loss anemia - hemodynamically stable and asymptomatic - po iron and vitamins  2) O POS/ RI/ VI  3) TDAP UTD   4) Breast/ contraception-POP  5) Disposition-probably POD 3  Dalia Heading, [place], [place]

## 2017-02-21 NOTE — Anesthesia Postprocedure Evaluation (Signed)
Anesthesia Post Note  Patient: Keyana Guevara  Procedure(s) Performed: CESAREAN SECTION (N/A Abdomen)  Patient location during evaluation: Mother Baby Anesthesia Type: Epidural Level of consciousness: awake, awake and alert and oriented Pain management: pain level controlled Vital Signs Assessment: post-procedure vital signs reviewed and stable Respiratory status: spontaneous breathing Cardiovascular status: blood pressure returned to baseline Postop Assessment: no headache, no backache, no apparent nausea or vomiting and adequate PO intake Anesthetic complications: no     Last Vitals:  Vitals:   02/21/17 0404 02/21/17 0754  BP: (!) 147/80 (!) 151/96  Pulse: 72 71  Resp: 18 18  Temp: 36.7 C 36.9 C  SpO2: 96% 97%    Last Pain:  Vitals:   02/21/17 0809  TempSrc:   PainSc: 7                  Stormy Sabol

## 2017-02-22 LAB — CBC WITH DIFFERENTIAL/PLATELET
Basophils Absolute: 0 10*3/uL (ref 0–0.1)
Basophils Relative: 0 %
EOS ABS: 0.2 10*3/uL (ref 0–0.7)
EOS PCT: 1 %
HCT: 29.7 % — ABNORMAL LOW (ref 35.0–47.0)
Hemoglobin: 10 g/dL — ABNORMAL LOW (ref 12.0–16.0)
LYMPHS ABS: 3.1 10*3/uL (ref 1.0–3.6)
Lymphocytes Relative: 22 %
MCH: 28.1 pg (ref 26.0–34.0)
MCHC: 33.5 g/dL (ref 32.0–36.0)
MCV: 83.8 fL (ref 80.0–100.0)
MONOS PCT: 8 %
Monocytes Absolute: 1.1 10*3/uL — ABNORMAL HIGH (ref 0.2–0.9)
Neutro Abs: 9.6 10*3/uL — ABNORMAL HIGH (ref 1.4–6.5)
Neutrophils Relative %: 69 %
Platelets: 277 10*3/uL (ref 150–440)
RBC: 3.55 MIL/uL — ABNORMAL LOW (ref 3.80–5.20)
RDW: 15.2 % — ABNORMAL HIGH (ref 11.5–14.5)
WBC: 14 10*3/uL — ABNORMAL HIGH (ref 3.6–11.0)

## 2017-02-22 LAB — PROTEIN / CREATININE RATIO, URINE
Creatinine, Urine: 43 mg/dL
Protein Creatinine Ratio: 0.51 mg/mg{Cre} — ABNORMAL HIGH (ref 0.00–0.15)
TOTAL PROTEIN, URINE: 22 mg/dL

## 2017-02-22 LAB — COMPREHENSIVE METABOLIC PANEL
ALK PHOS: 114 U/L (ref 38–126)
ALT: 17 U/L (ref 14–54)
AST: 31 U/L (ref 15–41)
Albumin: 2.5 g/dL — ABNORMAL LOW (ref 3.5–5.0)
Anion gap: 5 (ref 5–15)
BUN: 5 mg/dL — AB (ref 6–20)
CALCIUM: 8.3 mg/dL — AB (ref 8.9–10.3)
CO2: 26 mmol/L (ref 22–32)
CREATININE: 0.64 mg/dL (ref 0.44–1.00)
Chloride: 107 mmol/L (ref 101–111)
Glucose, Bld: 76 mg/dL (ref 65–99)
Potassium: 3.6 mmol/L (ref 3.5–5.1)
Sodium: 138 mmol/L (ref 135–145)
Total Bilirubin: 0.4 mg/dL (ref 0.3–1.2)
Total Protein: 6 g/dL — ABNORMAL LOW (ref 6.5–8.1)

## 2017-02-22 MED ORDER — NIFEDIPINE ER 30 MG PO TB24
30.0000 mg | ORAL_TABLET | Freq: Every day | ORAL | Status: DC
  Administered 2017-02-22 – 2017-02-23 (×2): 30 mg via ORAL
  Filled 2017-02-22 (×2): qty 1

## 2017-02-22 MED ORDER — INFLUENZA VAC SPLIT QUAD 0.5 ML IM SUSY
0.5000 mL | PREFILLED_SYRINGE | INTRAMUSCULAR | Status: AC | PRN
  Administered 2017-02-23: 0.5 mL via INTRAMUSCULAR
  Filled 2017-02-22: qty 0.5

## 2017-02-22 NOTE — Progress Notes (Signed)
Progress Note  Called during the night when patient's blood pressure in severe range 163/110, repeat 156/114. Patient asymptomatic-denied headache, visual changes, chest pain, RUQ pain. Was induced for gestational hypertension and had been having mild range blood pressures postpartum. Was treated with 30 mgm Procardia XL and labs drawn. Blood pressures responded to the procardia and she is now normotensive. Has not slept during the night due to baby's crying. FOB says baby is passing a lot of gas FOB and patient stressed during the night and feel that that has contributed to blood pressure being elevated. Both really want to go home today.  O:Temp:  [97.8 F (36.6 C)-98.5 F (36.9 C)] 97.8 F (36.6 C) (10/10 0729) Pulse Rate:  [67-124] 124 (10/10 0729) Resp:  [18] 18 (10/10 0729) BP: (135-163)/(80-114) 135/80 (10/10 0729) SpO2:  [96 %-98 %] 97 % (10/10 0729)  General: mother sitting up breast feeding baby, in NAD Heart: mild tachycardia, reg rhythm, no murmurs Lungs: CTA DTR's +3 Extremities: mild pedal edema  Results for orders placed or performed during the hospital encounter of 02/19/17 (from the past 24 hour(s))  Protein / creatinine ratio, urine     Status: Abnormal   Collection Time: 02/22/17  3:38 AM  Result Value Ref Range   Creatinine, Urine 43 mg/dL   Total Protein, Urine 22 mg/dL   Protein Creatinine Ratio 0.51 (H) 0.00 - 0.15 mg/mg[Cre]  CBC with Differential/Platelet     Status: Abnormal   Collection Time: 02/22/17  3:51 AM  Result Value Ref Range   WBC 14.0 (H) 3.6 - 11.0 K/uL   RBC 3.55 (L) 3.80 - 5.20 MIL/uL   Hemoglobin 10.0 (L) 12.0 - 16.0 g/dL   HCT 40.9 (L) 81.1 - 91.4 %   MCV 83.8 80.0 - 100.0 fL   MCH 28.1 26.0 - 34.0 pg   MCHC 33.5 32.0 - 36.0 g/dL   RDW 78.2 (H) 95.6 - 21.3 %   Platelets 277 150 - 440 K/uL   Neutrophils Relative % 69 %   Neutro Abs 9.6 (H) 1.4 - 6.5 K/uL   Lymphocytes Relative 22 %   Lymphs Abs 3.1 1.0 - 3.6 K/uL   Monocytes Relative  8 %   Monocytes Absolute 1.1 (H) 0.2 - 0.9 K/uL   Eosinophils Relative 1 %   Eosinophils Absolute 0.2 0 - 0.7 K/uL   Basophils Relative 0 %   Basophils Absolute 0.0 0 - 0.1 K/uL  Comprehensive metabolic panel     Status: Abnormal   Collection Time: 02/22/17  3:51 AM  Result Value Ref Range   Sodium 138 135 - 145 mmol/L   Potassium 3.6 3.5 - 5.1 mmol/L   Chloride 107 101 - 111 mmol/L   CO2 26 22 - 32 mmol/L   Glucose, Bld 76 65 - 99 mg/dL   BUN 5 (L) 6 - 20 mg/dL   Creatinine, Ser 0.86 0.44 - 1.00 mg/dL   Calcium 8.3 (L) 8.9 - 10.3 mg/dL   Total Protein 6.0 (L) 6.5 - 8.1 g/dL   Albumin 2.5 (L) 3.5 - 5.0 g/dL   AST 31 15 - 41 U/L   ALT 17 14 - 54 U/L   Alkaline Phosphatase 114 38 - 126 U/L   Total Bilirubin 0.4 0.3 - 1.2 mg/dL   GFR calc non Af Amer >60 >60 mL/min   GFR calc Af Amer >60 >60 mL/min   Anion gap 5 5 - 15   A: Preeclampsia (PC ratio=510) Blood pressures responded to  antihypertensive Maternal exhaustion  P: Discussed following blood pressures today. Consider discharge later today on Procardia if blood pressures OK. Recommend getting some respite from baby care for a few hours to get some sleep. Discussed with patient and husband concern in discharging patient if blood pressures return to severe range. Did consult Dr Bonney Aid regarding decision whether to start magnesium (will hold for now)  Farrel Conners, CNM

## 2017-02-22 NOTE — Progress Notes (Signed)
Patient ID: Rebecca Gomez, female   DOB: [birth year], 26 y.o.   MRN: [id number]  Obstetric Postpartum/PostOperative Daily Progress Note Subjective:  26 y.o. G1P1001 status post repeat cesarean delivery.  She is ambulating, is tolerating po, is voiding spontaneously.  Her pain is well controlled on PO pain medications. Her lochia is less than menses. Denies current HA, visual changes, and Ruq pain. No side effects from nifedipine.     Medications SCHEDULED MEDICATIONS  . Ferrous Fumarate  1 tablet Oral Daily  . ibuprofen  600 mg Oral Q6H  . NIFEdipine  30 mg Oral Daily  . prenatal multivitamin  1 tablet Oral Q1200  . senna-docusate  2 tablet Oral Q24H  . simethicone  80 mg Oral TID PC  . simethicone  80 mg Oral Q24H    MEDICATION INFUSIONS  . bupivacaine 0.25 % ON-Q pump DUAL CATH 400 mL      PRN MEDICATIONS  acetaminophen, coconut oil, witch hazel-glycerin **AND** dibucaine, diphenhydrAMINE, menthol-cetylpyridinium, oxyCODONE-acetaminophen, oxyCODONE-acetaminophen, simethicone, zolpidem    Objective:   Vitals:   02/22/17 0307 02/22/17 0325 02/22/17 0434 02/22/17 0729  BP: (!) 163/110 (!) 156/114 (!) 149/85 135/80  Pulse: 67 75 84 (!) 124  Resp: 18   18  Temp: 97.9 F (36.6 C)   97.8 F (36.6 C)  TempSrc: Oral   Oral  SpO2:    97%  Weight:      Height:        Current Vital Signs 24h Vital Sign Ranges  T 97.8 F (36.6 C) Temp  Avg: 98.2 F (36.8 C)  Min: 97.8 F (36.6 C)  Max: 98.5 F (36.9 C)  BP 135/80 BP  Min: 135/80  Max: 163/110  HR (!) 124 Pulse  Avg: 82.6  Min: 67  Max: 124  RR 18 Resp  Avg: 18  Min: 18  Max: 18  SaO2 97 % Not Delivered SpO2  Avg: 97 %  Min: 96 %  Max: 98 %       24 Hour I/O Current Shift I/O  Time Ins Outs 10/09 0701 - 10/10 0700 In: 2878 [P.O.:240; I.V.:2638] Out: 1700 [Urine:1700] No intake/output data recorded.  General: NAD Pulmonary: no increased work of breathing Abdomen: non-distended, non-tender, fundus firm at level of  umbilicus Inc: Clean/dry/intact Extremities: no edema, no erythema, no tenderness  Labs:   Recent Labs Lab 02/19/17 1413 02/21/17 0436 02/22/17 0351  WBC 10.7 13.4* 14.0*  HGB 11.2* 8.8* 10.0*  HCT 33.1* 26.4* 29.7*  PLT 283 235 277     Assessment:   26 y.o. G1P1001 postoperative day # 2, s/p repeat cesarean section, preeclampsia now requiring antihypertensives  Plan:  1) Acute blood loss anemia - hemodynamically stable and asymptomatic - po ferrous sulfate  2) O POS / Rubella immune / Varicella immune  3) TDAP status received,  Flu vaccine - will order prior to discharge  4) breast /Contraception = undecided   5) preeclampsia; monitor BPs tonight. Home tomorrow on nifedipine. F/u 1 week for incision and BP check  6) Disposition: home tomorrow  Conard Novak, MD, FACOG 02/22/2017 11:58 AM

## 2017-02-23 DIAGNOSIS — O1495 Unspecified pre-eclampsia, complicating the puerperium: Secondary | ICD-10-CM | POA: Diagnosis not present

## 2017-02-23 MED ORDER — NIFEDIPINE ER 30 MG PO TB24: 30.0000 mg | ORAL_TABLET | Freq: Every day | ORAL | 1 refills | Status: DC

## 2017-02-23 MED ORDER — IBUPROFEN 600 MG PO TABS: 600.0000 mg | ORAL_TABLET | Freq: Four times a day (QID) | ORAL | 1 refills | Status: DC | PRN

## 2017-02-23 MED ORDER — OXYCODONE-ACETAMINOPHEN 5-325 MG PO TABS: 1.0000 | ORAL_TABLET | Freq: Four times a day (QID) | ORAL | 0 refills | Status: DC | PRN

## 2017-02-23 NOTE — Progress Notes (Signed)
Discharge order received from doctor. Flu vaccine given prior to discharge. Reviewed discharge instructions and prescriptions with patient and answered all questions. On-Q pump removal instructions given. Incision cleaning kit given. Follow up appointment  given. Patient verbalized understanding. ID bands checked. Patient discharged home with infant via wheelchair by nursing/auxillary.    Hilbert Bible, RN

## 2017-02-23 NOTE — Discharge Instructions (Signed)
Please call your doctor or return to the ER if you experience any chest pains, shortness of breath, dizziness, visual changes, fever greater than 101, any heavy bleeding (saturating more than 1 pad per hour), large clots, or foul smelling discharge, any worsening abdominal pain and cramping that is not controlled by pain medication, or any signs of postpartum depression. No tampons, enemas, douches, or sexual intercourse for 6 weeks. Also avoid tub baths, hot tubs, or swimming for 6 weeks.  ° °Check your incision daily for any signs of infection such as redness, warmth, swelling, increased pain, or pus/foul smelling discharge ° ° °Activity: do not lift over 10 lbs for 6 weeks  °No driving for 1-2 weeks  °Pelvic rest for 6 weeks  °

## 2017-02-27 ENCOUNTER — Ambulatory Visit: Payer: BLUE CROSS/BLUE SHIELD | Admitting: Obstetrics & Gynecology

## 2017-02-28 ENCOUNTER — Encounter: Payer: Self-pay | Admitting: Obstetrics & Gynecology

## 2017-02-28 ENCOUNTER — Ambulatory Visit (INDEPENDENT_AMBULATORY_CARE_PROVIDER_SITE_OTHER): Payer: BLUE CROSS/BLUE SHIELD | Admitting: Obstetrics & Gynecology

## 2017-02-28 VITALS — BP 140/80 | HR 99 | Ht 63.0 in | Wt 193.0 lb

## 2017-02-28 DIAGNOSIS — O169 Unspecified maternal hypertension, unspecified trimester: Secondary | ICD-10-CM

## 2017-02-28 MED ORDER — NORETHINDRONE 0.35 MG PO TABS: 1.0000 | ORAL_TABLET | Freq: Every day | ORAL | 11 refills | Status: DC

## 2017-02-28 NOTE — Progress Notes (Signed)
  Postoperative Follow-up Patient presents post op from CS for FTP, FITL at term, 1 week ago.  Subjective: Patient reports marked improvement in her preop symptoms. Eating a regular diet without difficulty. The patient is not having any pain.  Activity: normal activities of daily living. Patient reports vaginal sx's of None  Objective: BP 140/80   Pulse 99   Ht  (1.6 m)   Wt 193 lb (87.5 kg)   BMI 34.19 kg/m  Physical Exam  Constitutional: She is oriented to person, place, and time. She appears well-developed and well-nourished. No distress.  Cardiovascular: Normal rate.   Pulmonary/Chest: Effort normal.  Abdominal: Soft. She exhibits no distension. There is no tenderness.  Incision Healing Well   Musculoskeletal: Normal range of motion.  Neurological: She is alert and oriented to person, place, and time. No cranial nerve deficit.  Skin: Skin is warm and dry.  Psychiatric: She has a normal mood and affect.    Assessment: s/p :  cesarean section stable  Plan: Patient has done well after surgery with no apparent complications.  I have discussed the post-operative course to date, and the expected progress moving forward.  The patient understands what complications to be concerned about.  I will see the patient in routine follow up, or sooner if needed.    Activity plan: No heavy lifting. Minipill Rx, start in 2 weeks Cont Procardia for HTN, stable.  Taper off after next visit.  Monitor for side effects  Letitia Libra 02/28/2017, 8:43 AM

## 2017-04-04 ENCOUNTER — Ambulatory Visit: Payer: BLUE CROSS/BLUE SHIELD | Admitting: Obstetrics & Gynecology

## 2017-04-18 ENCOUNTER — Encounter: Payer: Self-pay | Admitting: Obstetrics & Gynecology

## 2017-04-18 ENCOUNTER — Ambulatory Visit (INDEPENDENT_AMBULATORY_CARE_PROVIDER_SITE_OTHER): Payer: BLUE CROSS/BLUE SHIELD | Admitting: Obstetrics & Gynecology

## 2017-04-18 NOTE — Progress Notes (Signed)
  OBSTETRICS POSTPARTUM CLINIC PROGRESS NOTE  Subjective:     Rebecca Gomez is a 26 y.o. 861P1001 female who presents for a postpartum visit. She is 6 weeks postpartum following a Term pregnancy and delivery by C-section failure to progress.  I have fully reviewed the prenatal and intrapartum course. Anesthesia: epidural.  Postpartum course has been complicated by uncomplicated.  Baby is feeding by Breast.  Bleeding: patient has not  resumed menses.  Bowel function is normal. Bladder function is normal.  Patient is not sexually active. Contraception method desired is oral progesterone-only contraceptive.  Postpartum depression screening: negative. Edinburgh 7.  The following portions of the patient's history were reviewed and updated as appropriate: allergies, current medications, past family history, past medical history, past social history, past surgical history and problem list.  Review of Systems Pertinent items are noted in HPI.  Objective:    BP 110/80   Pulse 82   Ht 5\' 3"  (1.6 m)   Wt 184 lb (83.5 kg)   LMP 03/31/2017   BMI 32.59 kg/m   General:  alert and no distress   Breasts:  inspection negative, no nipple discharge or bleeding, no masses or nodularity palpable  Lungs: clear to auscultation bilaterally  Heart:  regular rate and rhythm, S1, S2 normal, no murmur, click, rub or gallop  Abdomen: soft, non-tender; bowel sounds normal; no masses,  no organomegaly.   Well healed Pfannenstiel incision   Vulva:  normal  Vagina: normal vagina, no discharge, exudate, lesion, or erythema  Cervix:  no cervical motion tenderness and no lesions  Corpus: normal size, contour, position, consistency, mobility, non-tender  Adnexa:  normal adnexa and no mass, fullness, tenderness  Rectal Exam: Not performed.        Assessment:  Post Partum Care visit 1. Postpartum care following cesarean delivery Doing well.  Plan:  See orders and Patient Instructions Contraceptive counseling for  oral progesterone-only contraceptive, then Nuvaring  Resume all normal activities Follow up in: 3 months or as needed. PAP due then  Annamarie MajorPaul Harris, MD, Merlinda FrederickFACOG [facility], [facility] 04/18/2017  1:58 PM

## 2018-06-20 DIAGNOSIS — B349 Viral infection, unspecified: Secondary | ICD-10-CM | POA: Diagnosis not present

## 2019-05-19 DIAGNOSIS — J020 Streptococcal pharyngitis: Secondary | ICD-10-CM | POA: Diagnosis not present

## 2019-05-19 DIAGNOSIS — Z20822 Contact with and (suspected) exposure to covid-19: Secondary | ICD-10-CM | POA: Diagnosis not present

## 2019-05-19 DIAGNOSIS — R070 Pain in throat: Secondary | ICD-10-CM | POA: Diagnosis not present

## 2019-08-22 ENCOUNTER — Encounter: Payer: Self-pay | Admitting: Obstetrics & Gynecology

## 2019-08-22 ENCOUNTER — Other Ambulatory Visit (HOSPITAL_COMMUNITY)
Admission: RE | Admit: 2019-08-22 | Discharge: 2019-08-22 | Disposition: A | Discharge status: Home / Self Care | Payer: BC Managed Care – PPO | Source: Ambulatory Visit | Attending: Obstetrics & Gynecology | Admitting: Obstetrics & Gynecology

## 2019-08-22 ENCOUNTER — Other Ambulatory Visit: Payer: Self-pay

## 2019-08-22 ENCOUNTER — Ambulatory Visit (INDEPENDENT_AMBULATORY_CARE_PROVIDER_SITE_OTHER): Payer: BC Managed Care – PPO | Admitting: Obstetrics & Gynecology

## 2019-08-22 VITALS — BP 120/80 | Ht 63.0 in | Wt 211.0 lb

## 2019-08-22 DIAGNOSIS — Z124 Encounter for screening for malignant neoplasm of cervix: Secondary | ICD-10-CM | POA: Insufficient documentation

## 2019-08-22 DIAGNOSIS — Z01419 Encounter for gynecological examination (general) (routine) without abnormal findings: Secondary | ICD-10-CM

## 2019-08-22 DIAGNOSIS — Z13220 Encounter for screening for lipoid disorders: Secondary | ICD-10-CM

## 2019-08-22 DIAGNOSIS — Z1321 Encounter for screening for nutritional disorder: Secondary | ICD-10-CM

## 2019-08-22 DIAGNOSIS — Z131 Encounter for screening for diabetes mellitus: Secondary | ICD-10-CM

## 2019-08-22 DIAGNOSIS — Z1329 Encounter for screening for other suspected endocrine disorder: Secondary | ICD-10-CM

## 2019-08-22 NOTE — Patient Instructions (Signed)
PAP every three years Labs soon   

## 2019-08-22 NOTE — Progress Notes (Signed)
HPI:      [name] is a 29 y.o. G1P1001 who LMP was Patient's last menstrual period was 08/08/2019., she presents today for her annual examination. The patient has no complaints today. The patient is sexually active. Her last pap: approximate date 2018 and was normal. The patient does perform self breast exams.  There is notable family history of breast or ovarian cancer in her family.  The patient has regular exercise: yes.  The patient denies current symptoms of depression.    GYN History: Contraception: none  PMHx: Past Medical History:  Diagnosis Date  . Medical history non-contributory    Past Surgical History:  Procedure Laterality Date  . CESAREAN SECTION N/A 02/20/2017   Procedure: CESAREAN SECTION;  Surgeon: Rebecca Mustard, MD;  Location: [facility] ORS;  Service: Obstetrics;  Laterality: N/A;  Time of Birth: 11:45 Sex: Female Wt:   . NO PAST SURGERIES     Family History  Problem Relation Age of Onset  . Diabetes Mother   . Hypertension Mother    Social History   Tobacco Use  . Smoking status: Never Smoker  . Smokeless tobacco: Never Used  Substance Use Topics  . Alcohol use: No  . Drug use: No    Current Outpatient Medications:  .  ibuprofen (ADVIL,MOTRIN) 600 MG tablet, Take 1 tablet (600 mg total) by mouth every 6 (six) hours as needed for headache, mild pain, moderate pain or cramping. (Patient not taking: Reported on 08/22/2019), Disp: 30 tablet, Rfl: 1 .  norethindrone (MICRONOR,CAMILA,ERRIN) 0.35 MG tablet, Take 1 tablet (0.35 mg total) by mouth daily. (Patient not taking: Reported on 08/22/2019), Disp: 1 Package, Rfl: 11 .  Prenatal Vit-Fe Fumarate-FA (MULTIVITAMIN-PRENATAL) 27-0.8 MG TABS tablet, Take 1 tablet by mouth daily at 12 noon., Disp: , Rfl:  Allergies: Patient has no known allergies.  Review of Systems  Constitutional: Negative for chills, fever and malaise/fatigue.  HENT: Negative for congestion, sinus pain and sore throat.   Eyes: Negative  for blurred vision and pain.  Respiratory: Negative for cough and wheezing.   Cardiovascular: Negative for chest pain and leg swelling.  Gastrointestinal: Negative for abdominal pain, constipation, diarrhea, heartburn, nausea and vomiting.  Genitourinary: Negative for dysuria, frequency, hematuria and urgency.  Musculoskeletal: Negative for back pain, joint pain, myalgias and neck pain.  Skin: Negative for itching and rash.  Neurological: Negative for dizziness, tremors and weakness.  Endo/Heme/Allergies: Does not bruise/bleed easily.  Psychiatric/Behavioral: Negative for depression. The patient is not nervous/anxious and does not have insomnia.     Objective: BP 120/80   Ht 5\' 3"  (1.6 m)   Wt 211 lb (95.7 kg)   LMP 08/08/2019   BMI 37.38 kg/m   Filed Weights   08/22/19 1404  Weight: 211 lb (95.7 kg)   Body mass index is 37.38 kg/m. Physical Exam Constitutional:      General: She is not in acute distress.    Appearance: She is well-developed.  Genitourinary:     Pelvic exam was performed with patient supine.     Vagina, uterus and rectum normal.     No lesions in the vagina.     No vaginal bleeding.     No cervical motion tenderness, friability, lesion or polyp.     Uterus is mobile.     Uterus is not enlarged.     No uterine mass detected.    Uterus is midaxial.     No right or left adnexal mass present.  Right adnexa not tender.     Left adnexa not tender.  HENT:     Head: Normocephalic and atraumatic. No laceration.     Right Ear: Hearing normal.     Left Ear: Hearing normal.     Mouth/Throat:     Pharynx: Uvula midline.  Eyes:     Pupils: Pupils are equal, round, and reactive to light.  Neck:     Thyroid: No thyromegaly.  Cardiovascular:     Rate and Rhythm: Normal rate and regular rhythm.     Heart sounds: No murmur. No friction rub. No gallop.   Pulmonary:     Effort: Pulmonary effort is normal. No respiratory distress.     Breath sounds: Normal breath  sounds. No wheezing.  Chest:     Breasts:        Right: No mass, skin change or tenderness.        Left: No mass, skin change or tenderness.  Abdominal:     General: Bowel sounds are normal. There is no distension.     Palpations: Abdomen is soft.     Tenderness: There is no abdominal tenderness. There is no rebound.  Musculoskeletal:        General: Normal range of motion.     Cervical back: Normal range of motion and neck supple.  Neurological:     Mental Status: She is alert and oriented to person, place, and time.     Cranial Nerves: No cranial nerve deficit.  Skin:    General: Skin is warm and dry.  Psychiatric:        Judgment: Judgment normal.  Vitals reviewed.     Assessment:  ANNUAL EXAM 1. Women's annual routine gynecological examination   2. Screening for cervical cancer   3. Screening for cholesterol level   4. Screening for thyroid disorder   5. Screening for diabetes mellitus   6. Encounter for vitamin deficiency screening      Screening Plan:            1.  Cervical Screening-  Pap smear done today  2. Breast screening- Exam annually and mammogram>40 planned   3. Colonoscopy every 10 years, Hemoccult testing - after age 2  4. Labs To return fasting at a later date  5. Counseling for contraception: no method, desires pregnancy again soon     F/U  Return in about 1 year (around 08/21/2020) for Annual, also lab appt soon (fasting).  Rebecca Applebaum, MD, [facility], [facility] 08/22/2019  2:31 PM

## 2019-08-26 ENCOUNTER — Other Ambulatory Visit: Payer: BC Managed Care – PPO

## 2019-08-26 ENCOUNTER — Other Ambulatory Visit: Payer: Self-pay

## 2019-08-26 DIAGNOSIS — Z131 Encounter for screening for diabetes mellitus: Secondary | ICD-10-CM

## 2019-08-26 DIAGNOSIS — Z1329 Encounter for screening for other suspected endocrine disorder: Secondary | ICD-10-CM

## 2019-08-26 DIAGNOSIS — Z1321 Encounter for screening for nutritional disorder: Secondary | ICD-10-CM | POA: Diagnosis not present

## 2019-08-26 DIAGNOSIS — Z13220 Encounter for screening for lipoid disorders: Secondary | ICD-10-CM | POA: Diagnosis not present

## 2019-08-26 LAB — CYTOLOGY - PAP: Diagnosis: NEGATIVE

## 2019-08-27 LAB — LIPID PANEL
Chol/HDL Ratio: 2.7 ratio (ref 0.0–4.4)
Cholesterol, Total: 111 mg/dL (ref 100–199)
HDL: 41 mg/dL (ref >39)
LDL Chol Calc (NIH): 53 mg/dL (ref 0–99)
Triglycerides: 85 mg/dL (ref 0–149)
VLDL Cholesterol Cal: 17 mg/dL (ref 5–40)

## 2019-08-27 LAB — GLUCOSE, FASTING: Glucose, Plasma: 86 mg/dL (ref 65–99)

## 2019-08-27 LAB — VITAMIN D 25 HYDROXY (VIT D DEFICIENCY, FRACTURES): Vit D, 25-Hydroxy: 32.4 ng/mL (ref 30.0–100.0)

## 2019-08-27 LAB — TSH: TSH: 1.41 u[IU]/mL (ref 0.450–4.500)

## 2020-01-14 ENCOUNTER — Telehealth: Payer: Self-pay

## 2020-01-14 NOTE — Telephone Encounter (Signed)
Pt calling; recently found out she is preg; her job is mandating covid vac for employees.  Is it okay to get with preg; do we recommend it?  [phone]  Pt aware per PH it is okay to get the covid vaccine at any point in the preg.

## 2020-02-10 ENCOUNTER — Encounter: Payer: BC Managed Care – PPO | Admitting: Obstetrics & Gynecology

## 2020-02-19 ENCOUNTER — Encounter: Payer: Self-pay | Admitting: Obstetrics & Gynecology

## 2020-02-19 ENCOUNTER — Ambulatory Visit (INDEPENDENT_AMBULATORY_CARE_PROVIDER_SITE_OTHER): Payer: BC Managed Care – PPO | Admitting: Obstetrics & Gynecology

## 2020-02-19 ENCOUNTER — Other Ambulatory Visit: Payer: Self-pay

## 2020-02-19 ENCOUNTER — Other Ambulatory Visit (HOSPITAL_COMMUNITY)
Admission: RE | Admit: 2020-02-19 | Discharge: 2020-02-19 | Disposition: A | Discharge status: Home / Self Care | Payer: BC Managed Care – PPO | Source: Ambulatory Visit | Attending: Obstetrics & Gynecology | Admitting: Obstetrics & Gynecology

## 2020-02-19 VITALS — BP 120/80

## 2020-02-19 DIAGNOSIS — O0991 Supervision of high risk pregnancy, unspecified, first trimester: Secondary | ICD-10-CM

## 2020-02-19 DIAGNOSIS — Z3A08 8 weeks gestation of pregnancy: Secondary | ICD-10-CM | POA: Insufficient documentation

## 2020-02-19 DIAGNOSIS — Z23 Encounter for immunization: Secondary | ICD-10-CM

## 2020-02-19 DIAGNOSIS — Z98891 History of uterine scar from previous surgery: Secondary | ICD-10-CM | POA: Insufficient documentation

## 2020-02-19 DIAGNOSIS — Z3201 Encounter for pregnancy test, result positive: Secondary | ICD-10-CM | POA: Diagnosis not present

## 2020-02-19 DIAGNOSIS — Z131 Encounter for screening for diabetes mellitus: Secondary | ICD-10-CM

## 2020-02-19 LAB — POCT URINALYSIS DIPSTICK OB
Glucose, UA: NEGATIVE
POC,PROTEIN,UA: NEGATIVE

## 2020-02-19 LAB — POCT URINE PREGNANCY: Preg Test, Ur: POSITIVE — AB

## 2020-02-19 NOTE — Progress Notes (Signed)
02/19/2020   Chief Complaint: Missed period  Transfer of Care Patient: no  History of Present Illness: Rebecca Gomez is a 29 y.o. G2P1001 [redacted]w[redacted]d based on Patient's last menstrual period was 12/20/2019. with an Estimated Date of Delivery: 09/25/20, with the above CC.   Her periods were: regular periods every 28 days She was using no method when she conceived.  She has Positive signs or symptoms of nausea/vomiting of pregnancy. She has Negative signs or symptoms of miscarriage or preterm labor She identifies Negative Zika risk factors for her and her partner On any different medications around the time she conceived/early pregnancy: No  History of varicella: Yes   ROS: A 12-point review of systems was performed and negative, except as stated in the above HPI.  OBGYN History: As per HPI. OB History  Gravida Para Term Preterm AB Living  2 1 1     1   SAB TAB Ectopic Multiple Live Births        0 1    # Outcome Date GA Lbr Len/2nd Weight Sex Delivery Anes PTL Lv  2 Current           1 Term 02/20/17 [redacted]w[redacted]d  7 lb 4.4 oz (3.3 kg) F CS-LTranv EPI, Gen  LIV    Any issues with any prior pregnancies: no - CS for FITL after long IOL for gest HTN Any prior children are healthy, doing well, without any problems or issues: yes History of pap smears: Yes. Last pap smear 09/2019.  Abnormal: no  History of STIs: No   Past Medical History: Past Medical History:  Diagnosis Date  . Medical history non-contributory     Past Surgical History: Past Surgical History:  Procedure Laterality Date  . CESAREAN SECTION N/A 02/20/2017   Procedure: CESAREAN SECTION;  Surgeon: 04/22/2017, MD;  Location: [facility] ORS;  Service: Obstetrics;  Laterality: N/A;  Time of Birth: 11:[birth year] Sex: Female Wt:   . NO PAST SURGERIES      Family History:  Family History  Problem Relation Age of Onset  . Diabetes Mother   . Hypertension Mother    She denies any female cancers, bleeding or blood clotting disorders.  She  denies any history of mental retardation, birth defects or genetic disorders in her or the FOB's history  Social History:  Social History   Socioeconomic History  . Marital status: Married    Spouse name: Not on file  . Number of children: Not on file  . Years of education: Not on file  . Highest education level: Not on file  Occupational History  . Not on file  Tobacco Use  . Smoking status: Never Smoker  . Smokeless tobacco: Never Used  Vaping Use  . Vaping Use: Never used  Substance and Sexual Activity  . Alcohol use: No  . Drug use: No  . Sexual activity: Yes    Birth control/protection: None  Other Topics Concern  . Not on file  Social History Narrative  . Not on file   Social Determinants of Health   Financial Resource Strain:   . Difficulty of Paying Living Expenses: Not on file  Food Insecurity:   . Worried About [place]ara Mustard in the Last Year: Not on file  . Ran Out of Food in the Last Year: Not on file  Transportation Needs:   . Lack of Transportation (Medical): Not on file  . Lack of Transportation (Non-Medical): Not on file  Physical Activity:   .  Days of Exercise per Week: Not on file  . Minutes of Exercise per Session: Not on file  Stress:   . Feeling of Stress : Not on file  Social Connections:   . Frequency of Communication with Friends and Family: Not on file  . Frequency of Social Gatherings with Friends and Family: Not on file  . Attends Religious Services: Not on file  . Active Member of Clubs or Organizations: Not on file  . Attends Banker Meetings: Not on file  . Marital Status: Not on file  Intimate Partner Violence:   . Fear of Current or Ex-Partner: Not on file  . Emotionally Abused: Not on file  . Physically Abused: Not on file  . Sexually Abused: Not on file   Any pets in the household: no  Allergy: No Known Allergies  Current Outpatient Medications:  Current Outpatient Medications:  .  ibuprofen  (ADVIL,MOTRIN) 600 MG tablet, Take 1 tablet (600 mg total) by mouth every 6 (six) hours as needed for headache, mild pain, moderate pain or cramping. (Patient not taking: Reported on 08/22/2019), Disp: 30 tablet, Rfl: 1 .  norethindrone (MICRONOR,CAMILA,ERRIN) 0.35 MG tablet, Take 1 tablet (0.35 mg total) by mouth daily. (Patient not taking: Reported on 08/22/2019), Disp: 1 Package, Rfl: 11 .  Prenatal Vit-Fe Fumarate-FA (MULTIVITAMIN-PRENATAL) 27-0.8 MG TABS tablet, Take 1 tablet by mouth daily at 12 noon. (Patient not taking: Reported on 02/19/2020), Disp: , Rfl:    Physical Exam:   BP 120/80   LMP 12/20/2019  There is no height or weight on file to calculate BMI. Constitutional: Well nourished, well developed female in no acute distress.  Neck:  Supple, normal appearance, and no thyromegaly  Cardiovascular: S1, S2 normal, no murmur, rub or gallop, regular rate and rhythm Respiratory:  Clear to auscultation bilateral. Normal respiratory effort Abdomen: positive bowel sounds and no masses, hernias; diffusely non tender to palpation, non distended Breasts: breasts appear normal, no suspicious masses, no skin or nipple changes or axillary nodes. Neuro/Psych:  Normal mood and affect.  Skin:  Warm and dry.  Lymphatic:  No inguinal lymphadenopathy.   Pelvic exam: is not limited by body habitus EGBUS: within normal limits, Vagina: within normal limits and with no blood in the vault, Cervix: normal appearing cervix without discharge or lesions, closed/long/high, Uterus:  enlarged: 6 weeks, and Adnexa:  no mass, fullness, tenderness  Assessment: Rebecca Gomez is a 29 y.o. G2P1001 [redacted]w[redacted]d based on Patient's last menstrual period was 12/20/2019. with an Estimated Date of Delivery: 09/25/20,  for prenatal care.  Plan:  1) Avoid alcoholic beverages. 2) Patient encouraged not to smoke.  3) Discontinue the use of all non-medicinal drugs and chemicals.  4) Take prenatal vitamins daily.  5) Seatbelt use  advised 6) Nutrition, food safety (fish, cheese advisories, and high nitrite foods) and exercise discussed. 7) Hospital and practice style delivering at [facility] discussed  8) Patient is asked about travel to areas at risk for the Zika virus, and counseled to avoid travel and exposure to mosquitoes or sexual partners who may have themselves been exposed to the virus. Testing is discussed, and will be ordered as appropriate.  9) Childbirth classes at [facility] advised 10) Genetic Screening, such as with 1st Trimester Screening, cell free fetal DNA, AFP testing, and Ultrasound, as well as with amniocentesis and CVS as appropriate, is discussed with patient. She plans to have not genetic testing this pregnancy. 11) flu shot today 12) [place] soon 13) Obesity RF discussed.  Early glucola, weight mgt goals, baby ASA. 14) Prior CS, desires TOLAC.  Discussed pros and cons of each choice.  Problem list reviewed and updated.  Rebecca Major, MD, [facility], [facility] 02/19/2020  2:42 PM

## 2020-02-19 NOTE — Patient Instructions (Signed)
First Trimester of Pregnancy The first trimester of pregnancy is from week 1 until the end of week 13 (months 1 through 3). A week after a sperm fertilizes an egg, the egg will implant on the wall of the uterus. This embryo will begin to develop into a baby. Genes from you and your partner will form the baby. The female genes will determine whether the baby will be a boy or a girl. At 6-8 weeks, the eyes and face will be formed, and the heartbeat can be seen on ultrasound. At the end of 12 weeks, all the baby's organs will be formed. Now that you are pregnant, you will want to do everything you can to have a healthy baby. Two of the most important things are to get good prenatal care and to follow your health care provider's instructions. Prenatal care is all the medical care you receive before the baby's birth. This care will help prevent, find, and treat any problems during the pregnancy and childbirth. Body changes during your first trimester Your body goes through many changes during pregnancy. The changes vary from woman to woman.  You may gain or lose a couple of pounds at first.  You may feel sick to your stomach (nauseous) and you may throw up (vomit). If the vomiting is uncontrollable, call your health care provider.  You may tire easily.  You may develop headaches that can be relieved by medicines. All medicines should be approved by your health care provider.  You may urinate more often. Painful urination may mean you have a bladder infection.  You may develop heartburn as a result of your pregnancy.  You may develop constipation because certain hormones are causing the muscles that push stool through your intestines to slow down.  You may develop hemorrhoids or swollen veins (varicose veins).  Your breasts may begin to grow larger and become tender. Your nipples may stick out more, and the tissue that surrounds them (areola) may become darker.  Your gums may bleed and may be  sensitive to brushing and flossing.  Dark spots or blotches (chloasma, mask of pregnancy) may develop on your face. This will likely fade after the baby is born.  Your menstrual periods will stop.  You may have a loss of appetite.  You may develop cravings for certain kinds of food.  You may have changes in your emotions from day to day, such as being excited to be pregnant or being concerned that something may go wrong with the pregnancy and baby.  You may have more vivid and strange dreams.  You may have changes in your hair. These can include thickening of your hair, rapid growth, and changes in texture. Some women also have hair loss during or after pregnancy, or hair that feels dry or thin. Your hair will most likely return to normal after your baby is born. What to expect at prenatal visits During a routine prenatal visit:  You will be weighed to make sure you and the baby are growing normally.  Your blood pressure will be taken.  Your abdomen will be measured to track your baby's growth.  The fetal heartbeat will be listened to between weeks 10 and 14 of your pregnancy.  Test results from any previous visits will be discussed. Your health care provider may ask you:  How you are feeling.  If you are feeling the baby move.  If you have had any abnormal symptoms, such as leaking fluid, bleeding, severe headaches, or abdominal   cramping.  If you are using any tobacco products, including cigarettes, chewing tobacco, and electronic cigarettes.  If you have any questions. Other tests that may be performed during your first trimester include:  Blood tests to find your blood type and to check for the presence of any previous infections. The tests will also be used to check for low iron levels (anemia) and protein on red blood cells (Rh antibodies). Depending on your risk factors, or if you previously had diabetes during pregnancy, you may have tests to check for high blood sugar  that affects pregnant women (gestational diabetes).  Urine tests to check for infections, diabetes, or protein in the urine.  An ultrasound to confirm the proper growth and development of the baby.  Fetal screens for spinal cord problems (spina bifida) and Down syndrome.  HIV (human immunodeficiency virus) testing. Routine prenatal testing includes screening for HIV, unless you choose not to have this test.  You may need other tests to make sure you and the baby are doing well. Follow these instructions at home: Medicines  Follow your health care provider's instructions regarding medicine use. Specific medicines may be either safe or unsafe to take during pregnancy.  Take a prenatal vitamin that contains at least 600 micrograms (mcg) of folic acid.  If you develop constipation, try taking a stool softener if your health care provider approves. Eating and drinking   Eat a balanced diet that includes fresh fruits and vegetables, whole grains, good sources of protein such as meat, eggs, or tofu, and low-fat dairy. Your health care provider will help you determine the amount of weight gain that is right for you.  Avoid raw meat and uncooked cheese. These carry germs that can cause birth defects in the baby.  Eating four or five small meals rather than three large meals a day may help relieve nausea and vomiting. If you start to feel nauseous, eating a few soda crackers can be helpful. Drinking liquids between meals, instead of during meals, also seems to help ease nausea and vomiting.  Limit foods that are high in fat and processed sugars, such as fried and sweet foods.  To prevent constipation: ? Eat foods that are high in fiber, such as fresh fruits and vegetables, whole grains, and beans. ? Drink enough fluid to keep your urine clear or pale yellow. Activity  Exercise only as directed by your health care provider. Most women can continue their usual exercise routine during  pregnancy. Try to exercise for 30 minutes at least 5 days a week. Exercising will help you: ? Control your weight. ? Stay in shape. ? Be prepared for labor and delivery.  Experiencing pain or cramping in the lower abdomen or lower back is a good sign that you should stop exercising. Check with your health care provider before continuing with normal exercises.  Try to avoid standing for long periods of time. Move your legs often if you must stand in one place for a long time.  Avoid heavy lifting.  Wear low-heeled shoes and practice good posture.  You may continue to have sex unless your health care provider tells you not to. Relieving pain and discomfort  Wear a good support bra to relieve breast tenderness.  Take warm sitz baths to soothe any pain or discomfort caused by hemorrhoids. Use hemorrhoid cream if your health care provider approves.  Rest with your legs elevated if you have leg cramps or low back pain.  If you develop varicose veins in   your legs, wear support hose. Elevate your feet for 15 minutes, 3-4 times a day. Limit salt in your diet. Prenatal care  Schedule your prenatal visits by the twelfth week of pregnancy. They are usually scheduled monthly at first, then more often in the last 2 months before delivery.  Write down your questions. Take them to your prenatal visits.  Keep all your prenatal visits as told by your health care provider. This is important. Safety  Wear your seat belt at all times when driving.  Make a list of emergency phone numbers, including numbers for family, friends, the hospital, and police and fire departments. General instructions  Ask your health care provider for a referral to a local prenatal education class. Begin classes no later than the beginning of month 6 of your pregnancy.  Ask for help if you have counseling or nutritional needs during pregnancy. Your health care provider can offer advice or refer you to specialists for help  with various needs.  Do not use hot tubs, steam rooms, or saunas.  Do not douche or use tampons or scented sanitary pads.  Do not cross your legs for long periods of time.  Avoid cat litter boxes and soil used by cats. These carry germs that can cause birth defects in the baby and possibly loss of the fetus by miscarriage or stillbirth.  Avoid all smoking, herbs, alcohol, and medicines not prescribed by your health care provider. Chemicals in these products affect the formation and growth of the baby.  Do not use any products that contain nicotine or tobacco, such as cigarettes and e-cigarettes. If you need help quitting, ask your health care provider. You may receive counseling support and other resources to help you quit.  Schedule a dentist appointment. At home, brush your teeth with a soft toothbrush and be gentle when you floss. Contact a health care provider if:  You have dizziness.  You have mild pelvic cramps, pelvic pressure, or nagging pain in the abdominal area.  You have persistent nausea, vomiting, or diarrhea.  You have a bad smelling vaginal discharge.  You have pain when you urinate.  You notice increased swelling in your face, hands, legs, or ankles.  You are exposed to fifth disease or chickenpox.  You are exposed to German measles (rubella) and have never had it. Get help right away if:  You have a fever.  You are leaking fluid from your vagina.  You have spotting or bleeding from your vagina.  You have severe abdominal cramping or pain.  You have rapid weight gain or loss.  You vomit blood or material that looks like coffee grounds.  You develop a severe headache.  You have shortness of breath.  You have any kind of trauma, such as from a fall or a car accident. Summary  The first trimester of pregnancy is from week 1 until the end of week 13 (months 1 through 3).  Your body goes through many changes during pregnancy. The changes vary from  woman to woman.  You will have routine prenatal visits. During those visits, your health care provider will examine you, discuss any test results you may have, and talk with you about how you are feeling. This information is not intended to replace advice given to you by your health care provider. Make sure you discuss any questions you have with your health care provider. Document Revised: 04/14/2017 Document Reviewed: 04/13/2016 Elsevier Patient Education  2020 [employer]  

## 2020-02-20 LAB — RPR+RH+ABO+RUB AB+AB SCR+CB...
Antibody Screen: NEGATIVE
HIV Screen 4th Generation wRfx: NONREACTIVE
Hematocrit: 40.8 % (ref 34.0–46.6)
Hemoglobin: 13.5 g/dL (ref 11.1–15.9)
Hepatitis B Surface Ag: NEGATIVE
MCH: 30.3 pg (ref 26.6–33.0)
MCHC: 33.1 g/dL (ref 31.5–35.7)
MCV: 92 fL (ref 79–97)
Platelets: 380 10*3/uL (ref 150–450)
RBC: 4.46 x10E6/uL (ref 3.77–5.28)
RDW: 12.6 % (ref 11.7–15.4)
RPR Ser Ql: NONREACTIVE
Rh Factor: POSITIVE
Rubella Antibodies, IGG: 7.31 index (ref >0.99)
Varicella zoster IgG: 206 index (ref >165)
WBC: 14.6 10*3/uL — ABNORMAL HIGH (ref 3.4–10.8)

## 2020-02-21 LAB — CERVICOVAGINAL ANCILLARY ONLY
Chlamydia: NEGATIVE
Comment: NEGATIVE
Comment: NEGATIVE
Comment: NORMAL
Neisseria Gonorrhea: NEGATIVE
Trichomonas: NEGATIVE

## 2020-02-21 LAB — URINE CULTURE: Organism ID, Bacteria: NO GROWTH

## 2020-03-04 ENCOUNTER — Encounter: Payer: BC Managed Care – PPO | Admitting: Advanced Practice Midwife

## 2020-03-09 ENCOUNTER — Telehealth: Payer: Self-pay

## 2020-03-09 NOTE — Telephone Encounter (Signed)
Called pt to follow up on her spotting from this weekend (she called the nurse line). Says she was spotting when wiped only and cramped some. She is good now. Advised if she was to bleed like a period flow and severe cramping to head over to ER.

## 2020-03-12 ENCOUNTER — Encounter
Admission: EM | Disposition: A | Discharge status: Home / Self Care | Payer: Self-pay | Source: Home / Self Care | Attending: Emergency Medicine

## 2020-03-12 ENCOUNTER — Ambulatory Visit
Admission: EM | Admit: 2020-03-12 | Discharge: 2020-03-12 | Disposition: A | Discharge status: Home / Self Care | Payer: BC Managed Care – PPO | Attending: Emergency Medicine | Admitting: Emergency Medicine

## 2020-03-12 ENCOUNTER — Emergency Department: Payer: BC Managed Care – PPO | Admitting: Anesthesiology

## 2020-03-12 ENCOUNTER — Emergency Department: Payer: BC Managed Care – PPO

## 2020-03-12 ENCOUNTER — Other Ambulatory Visit: Payer: Self-pay

## 2020-03-12 DIAGNOSIS — Z3A12 12 weeks gestation of pregnancy: Secondary | ICD-10-CM | POA: Diagnosis not present

## 2020-03-12 DIAGNOSIS — O034 Incomplete spontaneous abortion without complication: Secondary | ICD-10-CM

## 2020-03-12 DIAGNOSIS — Z20822 Contact with and (suspected) exposure to covid-19: Secondary | ICD-10-CM | POA: Insufficient documentation

## 2020-03-12 DIAGNOSIS — O209 Hemorrhage in early pregnancy, unspecified: Secondary | ICD-10-CM | POA: Diagnosis not present

## 2020-03-12 DIAGNOSIS — O039 Complete or unspecified spontaneous abortion without complication: Secondary | ICD-10-CM

## 2020-03-12 DIAGNOSIS — O411010 Infection of amniotic sac and membranes, unspecified, first trimester, not applicable or unspecified: Secondary | ICD-10-CM | POA: Diagnosis not present

## 2020-03-12 DIAGNOSIS — O34219 Maternal care for unspecified type scar from previous cesarean delivery: Secondary | ICD-10-CM | POA: Diagnosis not present

## 2020-03-12 DIAGNOSIS — N939 Abnormal uterine and vaginal bleeding, unspecified: Secondary | ICD-10-CM | POA: Insufficient documentation

## 2020-03-12 DIAGNOSIS — Z3A00 Weeks of gestation of pregnancy not specified: Secondary | ICD-10-CM | POA: Diagnosis not present

## 2020-03-12 HISTORY — PX: DILATION AND EVACUATION: SHX1459

## 2020-03-12 LAB — COMPREHENSIVE METABOLIC PANEL
ALT: 19 U/L (ref 0–44)
AST: 19 U/L (ref 15–41)
Albumin: 3.7 g/dL (ref 3.5–5.0)
Alkaline Phosphatase: 62 U/L (ref 38–126)
Anion gap: 8 (ref 5–15)
BUN: 11 mg/dL (ref 6–20)
CO2: 23 mmol/L (ref 22–32)
Calcium: 8.9 mg/dL (ref 8.9–10.3)
Chloride: 106 mmol/L (ref 98–111)
Creatinine, Ser: 0.58 mg/dL (ref 0.44–1.00)
GFR, Estimated: >60 mL/min (ref >60)
Glucose, Bld: 98 mg/dL (ref 70–99)
Potassium: 3.7 mmol/L (ref 3.5–5.1)
Sodium: 137 mmol/L (ref 135–145)
Total Bilirubin: 0.4 mg/dL (ref 0.3–1.2)
Total Protein: 7.4 g/dL (ref 6.5–8.1)

## 2020-03-12 LAB — CBC
HCT: 38 % (ref 36.0–46.0)
HCT: 32.6 % — ABNORMAL LOW (ref 36.0–46.0)
Hemoglobin: 13.1 g/dL (ref 12.0–15.0)
Hemoglobin: 11.3 g/dL — ABNORMAL LOW (ref 12.0–15.0)
MCH: 30.6 pg (ref 26.0–34.0)
MCH: 30.7 pg (ref 26.0–34.0)
MCHC: 34.5 g/dL (ref 30.0–36.0)
MCHC: 34.7 g/dL (ref 30.0–36.0)
MCV: 88.8 fL (ref 80.0–100.0)
MCV: 88.6 fL (ref 80.0–100.0)
Platelets: 354 10*3/uL (ref 150–400)
Platelets: 307 10*3/uL (ref 150–400)
RBC: 4.28 MIL/uL (ref 3.87–5.11)
RBC: 3.68 MIL/uL — ABNORMAL LOW (ref 3.87–5.11)
RDW: 12.6 % (ref 11.5–15.5)
RDW: 12.5 % (ref 11.5–15.5)
WBC: 16.4 10*3/uL — ABNORMAL HIGH (ref 4.0–10.5)
WBC: 19 10*3/uL — ABNORMAL HIGH (ref 4.0–10.5)
nRBC: 0 % (ref 0.0–0.2)
nRBC: 0 % (ref 0.0–0.2)

## 2020-03-12 LAB — URINALYSIS, COMPLETE (UACMP) WITH MICROSCOPIC
Bacteria, UA: NONE SEEN
Bilirubin Urine: NEGATIVE
Glucose, UA: NEGATIVE mg/dL
Ketones, ur: NEGATIVE mg/dL
Leukocytes,Ua: NEGATIVE
Nitrite: NEGATIVE
Protein, ur: NEGATIVE mg/dL
RBC / HPF: >50 RBC/hpf — ABNORMAL HIGH (ref 0–5)
Specific Gravity, Urine: 1.006 (ref 1.005–1.030)
WBC, UA: NONE SEEN WBC/hpf (ref 0–5)
pH: 6 (ref 5.0–8.0)

## 2020-03-12 LAB — RESPIRATORY PANEL BY RT PCR (FLU A&B, COVID)
Influenza A by PCR: NEGATIVE
Influenza B by PCR: NEGATIVE
SARS Coronavirus 2 by RT PCR: NEGATIVE

## 2020-03-12 LAB — TYPE AND SCREEN
ABO/RH(D): O POS
Antibody Screen: NEGATIVE

## 2020-03-12 LAB — HCG, QUANTITATIVE, PREGNANCY: hCG, Beta Chain, Quant, S: 6207 m[IU]/mL — ABNORMAL HIGH (ref <5)

## 2020-03-12 SURGERY — DILATION AND EVACUATION, UTERUS
Anesthesia: General

## 2020-03-12 MED ORDER — ONDANSETRON HCL 4 MG/2ML IJ SOLN: 4.0000 mg | Freq: Once | INTRAMUSCULAR | Status: DC | PRN

## 2020-03-12 MED ORDER — LACTATED RINGERS IV SOLN: INTRAVENOUS | Status: DC

## 2020-03-12 MED ORDER — MIDAZOLAM HCL 2 MG/2ML IJ SOLN
INTRAMUSCULAR | Status: DC | PRN
  Administered 2020-03-12: 2 mg via INTRAVENOUS

## 2020-03-12 MED ORDER — ACETAMINOPHEN 10 MG/ML IV SOLN
INTRAVENOUS | Status: DC | PRN
  Administered 2020-03-12: 1000 mg via INTRAVENOUS

## 2020-03-12 MED ORDER — ACETAMINOPHEN 325 MG PO TABS: 650.0000 mg | ORAL_TABLET | ORAL | 0 refills | Status: DC | PRN

## 2020-03-12 MED ORDER — FENTANYL CITRATE (PF) 100 MCG/2ML IJ SOLN
INTRAMUSCULAR | Status: AC
  Filled 2020-03-12: qty 2

## 2020-03-12 MED ORDER — KETOROLAC TROMETHAMINE 30 MG/ML IJ SOLN
INTRAMUSCULAR | Status: DC | PRN
  Administered 2020-03-12: 30 mg via INTRAVENOUS

## 2020-03-12 MED ORDER — ONDANSETRON HCL 4 MG/2ML IJ SOLN
INTRAMUSCULAR | Status: DC | PRN
  Administered 2020-03-12: 4 mg via INTRAVENOUS

## 2020-03-12 MED ORDER — OXYCODONE HCL 5 MG/5ML PO SOLN: 5.0000 mg | Freq: Once | ORAL | Status: DC | PRN

## 2020-03-12 MED ORDER — FENTANYL CITRATE (PF) 100 MCG/2ML IJ SOLN: 25.0000 ug | INTRAMUSCULAR | Status: DC | PRN

## 2020-03-12 MED ORDER — PROPOFOL 10 MG/ML IV BOLUS
INTRAVENOUS | Status: DC | PRN
  Administered 2020-03-12: 200 mg via INTRAVENOUS

## 2020-03-12 MED ORDER — SODIUM CHLORIDE 0.9 % IV BOLUS
1000.0000 mL | Freq: Once | INTRAVENOUS | Status: AC
  Administered 2020-03-12: 1000 mL via INTRAVENOUS

## 2020-03-12 MED ORDER — LIDOCAINE HCL (PF) 2 % IJ SOLN
INTRAMUSCULAR | Status: AC
  Filled 2020-03-12: qty 5

## 2020-03-12 MED ORDER — PROPOFOL 10 MG/ML IV BOLUS
INTRAVENOUS | Status: AC
  Filled 2020-03-12: qty 20

## 2020-03-12 MED ORDER — FENTANYL CITRATE (PF) 100 MCG/2ML IJ SOLN
INTRAMUSCULAR | Status: DC | PRN
  Administered 2020-03-12 (×2): 25 ug via INTRAVENOUS
  Administered 2020-03-12: 50 ug via INTRAVENOUS

## 2020-03-12 MED ORDER — ACETAMINOPHEN 10 MG/ML IV SOLN: 1000.0000 mg | Freq: Once | INTRAVENOUS | Status: DC | PRN

## 2020-03-12 MED ORDER — MORPHINE SULFATE (PF) 2 MG/ML IV SOLN
2.0000 mg | Freq: Once | INTRAVENOUS | Status: AC
  Administered 2020-03-12: 2 mg via INTRAVENOUS

## 2020-03-12 MED ORDER — LIDOCAINE HCL (CARDIAC) PF 100 MG/5ML IV SOSY
PREFILLED_SYRINGE | INTRAVENOUS | Status: DC | PRN
  Administered 2020-03-12: 100 mg via INTRAVENOUS

## 2020-03-12 MED ORDER — DEXAMETHASONE SODIUM PHOSPHATE 10 MG/ML IJ SOLN
INTRAMUSCULAR | Status: DC | PRN
  Administered 2020-03-12: 10 mg via INTRAVENOUS

## 2020-03-12 MED ORDER — MORPHINE SULFATE (PF) 4 MG/ML IV SOLN
INTRAVENOUS | Status: AC
  Filled 2020-03-12: qty 1

## 2020-03-12 MED ORDER — MIDAZOLAM HCL 2 MG/2ML IJ SOLN
INTRAMUSCULAR | Status: AC
  Filled 2020-03-12: qty 2

## 2020-03-12 MED ORDER — IBUPROFEN 800 MG PO TABS: 800.0000 mg | ORAL_TABLET | Freq: Three times a day (TID) | ORAL | 0 refills | Status: DC | PRN

## 2020-03-12 MED ORDER — SODIUM CHLORIDE 0.9 % IV SOLN
100.0000 mg | INTRAVENOUS | Status: AC
  Administered 2020-03-12: 100 mg via INTRAVENOUS
  Filled 2020-03-12 (×2): qty 100

## 2020-03-12 MED ORDER — ACETAMINOPHEN 10 MG/ML IV SOLN
INTRAVENOUS | Status: AC
  Filled 2020-03-12: qty 100

## 2020-03-12 MED ORDER — ONDANSETRON 4 MG PO TBDP
4.0000 mg | ORAL_TABLET | Freq: Once | ORAL | Status: AC
  Administered 2020-03-12: 4 mg via ORAL
  Filled 2020-03-12: qty 1

## 2020-03-12 MED ORDER — OXYCODONE-ACETAMINOPHEN 5-325 MG PO TABS
1.0000 | ORAL_TABLET | Freq: Once | ORAL | Status: AC
  Administered 2020-03-12: 1 via ORAL
  Filled 2020-03-12: qty 1

## 2020-03-12 MED ORDER — OXYCODONE HCL 5 MG PO TABS: 5.0000 mg | ORAL_TABLET | Freq: Once | ORAL | Status: DC | PRN

## 2020-03-12 MED ORDER — HYDROCODONE-ACETAMINOPHEN 5-325 MG PO TABS: 1.0000 | ORAL_TABLET | Freq: Four times a day (QID) | ORAL | 0 refills | Status: DC | PRN

## 2020-03-12 SURGICAL SUPPLY — 22 items
BAG COUNTER SPONGE EZ (MISCELLANEOUS) ×2 IMPLANT
CATH ROBINSON RED A/P 16FR (CATHETERS) ×3 IMPLANT
COUNTER SPONGE BAG EZ (MISCELLANEOUS) ×1
FILTER UTR ASPR SPEC (MISCELLANEOUS) ×1 IMPLANT
FLTR UTR ASPR SPEC (MISCELLANEOUS) ×3
GLOVE BIOGEL PI IND STRL 6.5 (GLOVE) ×2 IMPLANT
GLOVE BIOGEL PI INDICATOR 6.5 (GLOVE) ×4
GLOVE SURG SYN 6.5 ES PF (GLOVE) ×12 IMPLANT
GOWN STRL REUS W/ TWL LRG LVL3 (GOWN DISPOSABLE) ×2 IMPLANT
GOWN STRL REUS W/TWL LRG LVL3 (GOWN DISPOSABLE) ×4
KIT BERKELEY 1ST TRIMESTER 3/8 (MISCELLANEOUS) ×3 IMPLANT
KIT TURNOVER CYSTO (KITS) ×3 IMPLANT
NS IRRIG 500ML POUR BTL (IV SOLUTION) ×3 IMPLANT
PACK DNC HYST (MISCELLANEOUS) ×3 IMPLANT
PAD OB MATERNITY 4.3X12.25 (PERSONAL CARE ITEMS) ×3 IMPLANT
PAD PREP 24X41 OB/GYN DISP (PERSONAL CARE ITEMS) ×3 IMPLANT
SET BERKELEY SUCTION TUBING (SUCTIONS) ×3 IMPLANT
TOWEL OR 17X26 4PK STRL BLUE (TOWEL DISPOSABLE) ×3 IMPLANT
VACURETTE 10 RIGID CVD (CANNULA) IMPLANT
VACURETTE 12 RIGID CVD (CANNULA) IMPLANT
VACURETTE 8 RIGID CVD (CANNULA) IMPLANT
VACURETTE 8MM F TIP (MISCELLANEOUS) ×3 IMPLANT

## 2020-03-12 NOTE — ED Triage Notes (Signed)
Pt states she is [redacted] weeks pregnant, started having spotting Sunday. Tonight woke up with worsening lower abd cramping went to commode and passed products of conception. States cramping has resolved, and bleeding has become lighter.

## 2020-03-12 NOTE — Discharge Instructions (Addendum)
Dilation and Curettage or Vacuum Curettage, Care After These instructions give you information about caring for yourself after your procedure. Your doctor may also give you more specific instructions. Call your doctor if you have any problems or questions after your procedure. Follow these instructions at home: Activity  Do not drive or use heavy machinery while taking prescription pain medicine.  For 24 hours after your procedure, avoid driving.  Take short walks often, followed by rest periods. Ask your doctor what activities are safe for you. After one or two days, you may be able to return to your normal activities.  Do not lift anything that is heavier than 10 lb (4.5 kg) until your doctor approves.  For at least 2 weeks, or as long as told by your doctor: ? Do not douche. ? Do not use tampons. ? Do not have sex. General instructions   Take over-the-counter and prescription medicines only as told by your doctor. This is very important if you take blood thinning medicine.  Do not take baths, swim, or use a hot tub until your doctor approves. Take showers instead of baths.  Wear compression stockings as told by your doctor.  It is up to you to get the results of your procedure. Ask your doctor when your results will be ready.  Keep all follow-up visits as told by your doctor. This is important. Contact a doctor if:  You have very bad cramps that get worse or do not get better with medicine.  You have very bad pain in your belly (abdomen).  You cannot drink fluids without throwing up (vomiting).  You get pain in a different part of the area between your belly and thighs (pelvis).  You have bad-smelling discharge from your vagina.  You have a rash. Get help right away if:  You are bleeding a lot from your vagina. A lot of bleeding means soaking more than one sanitary pad in an hour, for 2 hours in a row.  You have clumps of blood (blood clots) coming from your  vagina.  You have a fever or chills.  Your belly feels very tender or hard.  You have chest pain.  You have trouble breathing.  You cough up blood.  You feel dizzy.  You feel light-headed.  You pass out (faint).  You have pain in your neck or shoulder area. Summary  Take short walks often, followed by rest periods. Ask your doctor what activities are safe for you. After one or two days, you may be able to return to your normal activities.  Do not lift anything that is heavier than 10 lb (4.5 kg) until your doctor approves.  Do not take baths, swim, or use a hot tub until your doctor approves. Take showers instead of baths.  Contact your doctor if you have any symptoms of infection, like bad-smelling discharge from your vagina. This information is not intended to replace advice given to you by your health care provider. Make sure you discuss any questions you have with your health care provider. Document Revised: 04/14/2017 Document Reviewed: 01/18/2016 Elsevier Patient Education  2020 [employer].   Miscarriage A miscarriage is the loss of an unborn baby (fetus) before the 20th week of pregnancy. Most miscarriages happen during the first 3 months of pregnancy. Sometimes, a miscarriage can happen before a woman knows that she is pregnant. Having a miscarriage can be an emotional experience. If you have had a miscarriage, talk with your health care provider about any  questions you may have about miscarrying, the grieving process, and your plans for future pregnancy. What are the causes? A miscarriage may be caused by:  Problems with the genes or chromosomes of the fetus. These problems make it impossible for the baby to develop normally. They are often the result of random errors that occur early in the development of the baby, and are not passed from parent to child (not inherited).  Infection of the cervix or uterus.  Conditions that affect hormone balance in the  body.  Problems with the cervix, such as the cervix opening and thinning before pregnancy is at term (cervical insufficiency).  Problems with the uterus. These may include: ? A uterus with an abnormal shape. ? Fibroids in the uterus. ? Congenital abnormalities. These are problems that were present at birth.  Certain medical conditions.  Smoking, drinking alcohol, or using drugs.  Injury (trauma). In many cases, the cause of a miscarriage is not known. What are the signs or symptoms? Symptoms of this condition include:  Vaginal bleeding or spotting, with or without cramps or pain.  Pain or cramping in the abdomen or lower back.  Passing fluid, tissue, or blood clots from the vagina. How is this diagnosed? This condition may be diagnosed based on:  A physical exam.  Ultrasound.  Blood tests.  Urine tests. How is this treated? Treatment for a miscarriage is sometimes not necessary if you naturally pass all the tissue that was in your uterus. If necessary, this condition may be treated with:  Dilation and curettage (D&C). This is a procedure in which the cervix is stretched open and the lining of the uterus (endometrium) is scraped. This is done only if tissue from the fetus or placenta remains in the body (incomplete miscarriage).  Medicines, such as: ? Antibiotic medicine, to treat infection. ? Medicine to help the body pass any remaining tissue. ? Medicine to reduce (contract) the size of the uterus. These medicines may be given if you have a lot of bleeding. If you have Rh negative blood and your baby was Rh positive, you will need a shot of a medicine called Rh immunoglobulinto protect your future babies from Rh blood problems. "Rh-negative" and "Rh-positive" refer to whether or not the blood has a specific protein found on the surface of red blood cells (Rh factor). Follow these instructions at home: Medicines   Take over-the-counter and prescription medicines only as  told by your health care provider.  If you were prescribed antibiotic medicine, take it as told by your health care provider. Do not stop taking the antibiotic even if you start to feel better.  Do not take NSAIDs, such as aspirin and ibuprofen, unless they are approved by your health care provider. These medicines can cause bleeding. Activity  Rest as directed. Ask your health care provider what activities are safe for you.  Have someone help with home and family responsibilities during this time. General instructions  Keep track of the number of sanitary pads you use each day and how soaked (saturated) they are. Write down this information.  Monitor the amount of tissue or blood clots that you pass from your vagina. Save any large amounts of tissue for your health care provider to examine.  Do not use tampons, douche, or have sex until your health care provider approves.  To help you and your partner with the process of grieving, talk with your health care provider or seek counseling.  When you are ready, meet with  your health care provider to discuss any important steps you should take for your health. Also, discuss steps you should take to have a healthy pregnancy in the future.  Keep all follow-up visits as told by your health care provider. This is important. Where to find more information  The American Congress of Obstetricians and Gynecologists: www.acog.org  U.S. Department of Health and Cytogeneticist of Women's Health: http://hoffman.com/ Contact a health care provider if:  You have a fever or chills.  You have a foul smelling vaginal discharge.  You have more bleeding instead of less. Get help right away if:  You have severe cramps or pain in your back or abdomen.  You pass blood clots or tissue from your vagina that is walnut-sized or larger.  You soak more than 1 regular sanitary pad in an hour.  You become light-headed or weak.  You pass out.  You  have feelings of sadness that take over your thoughts, or you have thoughts of hurting yourself. Summary  Most miscarriages happen in the first 3 months of pregnancy. Sometimes miscarriage happens before a woman even knows that she is pregnant.  Follow your health care provider's instruction for home care. Keep all follow-up appointments.  To help you and your partner with the process of grieving, talk with your health care provider or seek counseling. This information is not intended to replace advice given to you by your health care provider. Make sure you discuss any questions you have with your health care provider. Document Revised: 08/24/2018 Document Reviewed: 06/07/2016 Elsevier Patient Education  2020 [employer]   AMBULATORY SURGERY  DISCHARGE INSTRUCTIONS   1) The drugs that you were given will stay in your system until tomorrow so for the next 24 hours you should not:  A) Drive an automobile B) Make any legal decisions C) Drink any alcoholic beverage   2) You may resume regular meals tomorrow.  Today it is better to start with liquids and gradually work up to solid foods.  You may eat anything you prefer, but it is better to start with liquids, then soup and crackers, and gradually work up to solid foods.   3) Please notify your doctor immediately if you have any unusual bleeding, trouble breathing, redness and pain at the surgery site, drainage, fever, or pain not relieved by medication.    4) Additional Instructions:        Please contact your physician with any problems or Same Day Surgery at [phone], Monday through Friday 6 am to 4 pm, or [place] at [facility] number at [phone].

## 2020-03-12 NOTE — OR Nursing (Signed)
Lab tech in for CBC draw as ordered 0946.

## 2020-03-12 NOTE — Transfer of Care (Signed)
Immediate Anesthesia Transfer of Care Note  Patient: Josefa Syracuse  Procedure(s) Performed: DILATATION AND EVACUATION (N/A )  Patient Location: PACU  Anesthesia Type:General  Level of Consciousness: awake, oriented and drowsy  Airway & Oxygen Therapy: Patient Spontanous Breathing and Patient connected to nasal cannula oxygen  Post-op Assessment: Report given to RN and Post -op Vital signs reviewed and stable  Post vital signs: stable  Last Vitals:  Vitals Value Taken Time  BP 110/70 03/12/20 0835  Temp    Pulse 62 03/12/20 0839  Resp 14 03/12/20 0839  SpO2 100 % 03/12/20 0839  Vitals shown include unvalidated device data.  Last Pain:  Vitals:   03/12/20 0616  TempSrc:   PainSc: 10-Worst pain ever         Complications: No complications documented.

## 2020-03-12 NOTE — ED Provider Notes (Signed)
[facility] Emergency Department Provider Note  ____________________________________________   First MD Initiated Contact with Patient 03/12/20 [phone]     (approximate)  I have reviewed the triage vital signs and the nursing notes.   HISTORY  Chief Complaint Vaginal Bleeding   HPI Rebecca Gomez is a 29 y.o. female G2, P1 approximately [redacted] weeks pregnant presents to the emergency department secondary to concern for miscarriage which occurred at home tonight.  Patient states that she began spotting on Sunday and tonight started experiencing worsening lower abdominal cramping with pain score of 10 out of 10.  Patient states that she went to the commode which point she passed what looked like a "baby".  Patient states that pain improved following the beforementioned.  However patient states that pain recurred and is currently 10 out of 10 with associated heavy vaginal bleeding.  Patient has saturated 3 pads while in the emergency department.  Patient also bled heavily while in ultrasound.        Past Medical History:  Diagnosis Date  . Medical history non-contributory     Patient Active Problem List   Diagnosis Date Noted  . History of cesarean delivery 02/19/2020  . High-risk pregnancy, first trimester 02/19/2020    Past Surgical History:  Procedure Laterality Date  . CESAREAN SECTION N/A 02/20/2017   Procedure: CESAREAN SECTION;  Surgeon: Nadara Mustard, MD;  Location: [facility] ORS;  Service: Obstetrics;  Laterality: N/A;  Time of Birth: 11:[birth year] Sex: Female Wt:   . NO PAST SURGERIES      Prior to Admission medications   Medication Sig Start Date End Date Taking? Authorizing Provider  Prenatal Vit-Fe Fumarate-FA (MULTIVITAMIN-PRENATAL) 27-0.8 MG TABS tablet Take 1 tablet by mouth daily at 12 noon. Patient not taking: Reported on 02/19/2020    [provider]    Allergies Patient has no known allergies.  Family History  Problem Relation Age of  Onset  . Diabetes Mother   . Hypertension Mother     Social History Social History   Tobacco Use  . Smoking status: Never Smoker  . Smokeless tobacco: Never Used  Vaping Use  . Vaping Use: Never used  Substance Use Topics  . Alcohol use: No  . Drug use: No    Review of Systems Constitutional: No fever/chills Eyes: No visual changes. ENT: No sore throat. Cardiovascular: Denies chest pain. Respiratory: Denies shortness of breath. Gastrointestinal: No abdominal pain.  No nausea, no vomiting.  No diarrhea.  No constipation. Genitourinary: Positive for pelvic pain and vaginal bleeding Musculoskeletal: Negative for neck pain.  Negative for back pain. Integumentary: Negative for rash. Neurological: Negative for headaches, focal weakness or numbness.   ____________________________________________   PHYSICAL EXAM:  VITAL SIGNS: ED Triage Vitals [03/12/20 0128]  Enc Vitals Group     BP 140/69     Pulse Rate 87     Resp 20     Temp 99 F (37.2 C)     Temp Source Oral     SpO2 97 %     Weight 95.3 kg (210 lb)     Height 1.6 m (5\' 3" )     Head Circumference      Peak Flow      Pain Score 0     Pain Loc      Pain Edu?      Excl. in GC?      Constitutional: Alert and oriented.  Eyes: Conjunctivae are normal.  Head: Atraumatic. Mouth/Throat: Patient is  wearing a mask. Neck: No stridor.  No meningeal signs.   Cardiovascular: Normal rate, regular rhythm. Good peripheral circulation. Grossly normal heart sounds. Respiratory: Normal respiratory effort.  No retractions. Gastrointestinal: Soft and nontender. No distention.  Genitourinary: Heavy vaginal bleeding noted with large clots Musculoskeletal: No lower extremity tenderness nor edema. No gross deformities of extremities. Neurologic:  Normal speech and language. No gross focal neurologic deficits are appreciated.  Skin:  Skin is warm, dry and intact. Psychiatric: Mood and affect are normal. Speech and behavior are  normal.  ____________________________________________   LABS (all labs ordered are listed, but only abnormal results are displayed)  Labs Reviewed  CBC - Abnormal; Notable for the following components:      Result Value   WBC 16.4 (*)    All other components within normal limits  HCG, QUANTITATIVE, PREGNANCY - Abnormal; Notable for the following components:   hCG, Beta Chain, Quant, S 6,207 (*)    All other components within normal limits  URINALYSIS, COMPLETE (UACMP) WITH MICROSCOPIC - Abnormal; Notable for the following components:   Color, Urine YELLOW (*)    APPearance CLEAR (*)    Hgb urine dipstick LARGE (*)    RBC / HPF >50 (*)    All other components within normal limits  RESPIRATORY PANEL BY RT PCR (FLU A&B, COVID)  COMPREHENSIVE METABOLIC PANEL  TYPE AND SCREEN     RADIOLOGY I, [place] Johnthan Axt[place], personally viewed and evaluated these images (plain radiographs) as part of my medical decision making, as well as reviewing the written report by the radiologist.  ED MD interpretation: No visible intra or extrauterine gestation on ultrasound per radiologist.  Official radiology report(s): US OB LESS THAN 14 WEEKS WITH OB TRANSVAGINAL  Result Date: 03/12/2020 CLINICAL DATA:  Twelve weeks pregnant with vaginal bleeding and passed products of conception. EXAM: OBSTETRIC <14 WK [place] AND TRANSVAGINAL OB US TECHNIQUE: Both transabdominal and transvaginal ultrasound examinations were performed for complete evaluation of the gestation as well as the maternal uterus, adnexal regions, and pelvic cul-de-sac. Transvaginal technique was performed to assess early pregnancy. COMPARISON:  None of this gestation FINDINGS: No visible intra or extra uterine gestational sac. Endometrial thickness is 18 mm with some heterogeneity of endometrial contents especially on transverse transvaginal images. On color Doppler images most of the signal appears localized to the myometrium, especially on the  transvaginal images which have the best resolution. Symmetric normal appearance of the ovaries. No adnexal mass or pelvic fluid. IMPRESSION: 1. No visible intra or extrauterine gestation, reportedly there has been preceding spontaneous abortion. 2. 18 mm endometrial thickness. Electronically Signed   By: Marnee Spring M.D.   On: 03/12/2020 05:19     Procedures   ____________________________________________   INITIAL IMPRESSION / MDM / ASSESSMENT AND PLAN / ED COURSE  As part of my medical decision making, I reviewed the following data within the electronic MEDICAL RECORD NUMBER   29 year old female present with above-stated history and physical exam concern for miscarriage with possible retained products of conception given the fact that the patient's pain is worsening with worsening vaginal bleeding.  Patient discussed with Dr. Gaynelle Arabian OB/GYN who evaluated the patient in the emergency department with plan for [facility]. ____________________________________________  FINAL CLINICAL IMPRESSION(S) / ED DIAGNOSES  Final diagnoses:  Miscarriage  Retained products of conception after miscarriage     MEDICATIONS GIVEN DURING THIS VISIT:  Medications  morphine 4 MG/ML injection (has no administration in time range)  oxyCODONE-acetaminophen (PERCOCET/ROXICET) 5-325 MG per  tablet 1 tablet (1 tablet Oral Given 03/12/20 0339)  ondansetron (ZOFRAN-ODT) disintegrating tablet 4 mg (4 mg Oral Given 03/12/20 0340)     ED Discharge Orders    None      *Please note:  Rebecca Gomez was evaluated in Emergency Department on 03/12/2020 for the symptoms described in the history of present illness. She was evaluated in the context of the global COVID-19 pandemic, which necessitated consideration that the patient might be at risk for infection with the SARS-CoV-2 virus that causes COVID-19. Institutional protocols and algorithms that pertain to the evaluation of patients at risk for COVID-19 are in a state of  rapid change based on information released by regulatory bodies including the CDC and federal and state organizations. These policies and algorithms were followed during the patient's care in the ED.  Some ED evaluations and interventions may be delayed as a result of limited staffing during and after the pandemic.*  Note:  This document was prepared using Dragon voice recognition software and may include unintentional dictation errors.   Darci Current, MD 03/12/20 [phone]

## 2020-03-12 NOTE — Op Note (Signed)
  Operative Note  03/12/2020 8:32 AM  PRE-OP DIAGNOSIS: incomplete miscarriage   POST-OP DIAGNOSIS: same  SURGEON: Adelene Idler MD  ANESTHESIA: General   PROCEDURE: Procedure(s): DILATATION AND EVACUATION   ESTIMATED BLOOD LOSS: 300 mL   SPECIMENS: POC   COMPLICATIONS: none  DISPOSITION: PACU - hemodynamically stable.  CONDITION: stable  FINDINGS: Exam under anesthesia revealed a 12 wk size uterus without palpable adnexal masses.   INDICATION FOR PROCEDURE: incomplete abortion  PROCEDURE IN DETAIL: After informed consent was obtained, the patient was taken to the operating room where anesthesia was obtained without difficulty. The patient was positioned in the dorsal lithotomy position with [employer]. Time out was performed and an exam under anesthesia was performed. The vagina, perineum, and lower abdomen were prepped and draped in a normal sterile fashion. The bladder was emptied with an I&O catheter. A speculum was placed into the vagina and the cervix was grasped with a single toothed tenaculum.  The cervix was visually dilated. The suction was then tested and found to be adequate, and a 7 flexible suction cannula was advanced into the uterine cavity. The suction was activated and the contents of the uterus were aspirated until no further tissue was obtained. The uterus was then curetted to gritty texture throughout.  At the end of the procedure bleeding was noted to be minimal.  All instruments were then removed from the vagina.The patient tolerated the procedure well. All sponge, instrument, and needle counts were correct. The patient was taken to the recovery room in good condition.   Adelene Idler MD [facility], [facility] 03/12/2020 8:32 AM

## 2020-03-12 NOTE — Anesthesia Preprocedure Evaluation (Signed)
Anesthesia Evaluation  Patient identified by MRN, date of birth, ID band Patient awake  General Assessment Comment:Appropriate NPO, denies n/v.  Reviewed: Allergy & Precautions, NPO status , Patient's Chart, lab work & pertinent test results  History of Anesthesia Complications Negative for: history of anesthetic complications  Airway Mallampati: I  TM Distance: >3 FB Neck ROM: Full    Dental no notable dental hx. (+) Teeth Intact   Pulmonary neg pulmonary ROS, neg sleep apnea, neg COPD, Patient abstained from smoking.Not current smoker,    Pulmonary exam normal breath sounds clear to auscultation       Cardiovascular Exercise Tolerance: Good METS(-) hypertension(-) CAD and (-) Past MI negative cardio ROS  (-) dysrhythmias  Rhythm:Regular Rate:Normal - Systolic murmurs    Neuro/Psych negative neurological ROS  negative psych ROS   GI/Hepatic neg GERD  ,(+)     (-) substance abuse  ,   Endo/Other  neg diabetes  Renal/GU negative Renal ROS     Musculoskeletal   Abdominal   Peds  Hematology   Anesthesia Other Findings Past Medical History: No date: Medical history non-contributory  Reproductive/Obstetrics 12 week miscarriage                             Anesthesia Physical Anesthesia Plan  ASA: II and emergent  Anesthesia Plan: General   Post-op Pain Management:    Induction: Intravenous  PONV Risk Score and Plan: 4 or greater and Ondansetron, Dexamethasone and Midazolam  Airway Management Planned: LMA  Additional Equipment: None  Intra-op Plan:   Post-operative Plan: Extubation in OR  Informed Consent: I have reviewed the patients History and Physical, chart, labs and discussed the procedure including the risks, benefits and alternatives for the proposed anesthesia with the patient or authorized representative who has indicated his/her understanding and acceptance.      Dental advisory given  Plan Discussed with: CRNA and Surgeon  Anesthesia Plan Comments: (Discussed risks of anesthesia with patient, including PONV, sore throat, lip/dental damage. Rare risks discussed as well, such as cardiorespiratory and neurological sequelae. Patient understands.)        Anesthesia Quick Evaluation

## 2020-03-12 NOTE — H&P (Signed)
Rebecca Gomez is an 29 y.o. female.   Chief Complaint: Miscarriage HPI: Patient presents to the ER tonight with complaints of heavy bleeding. Reports that she had a miscarriage at home and passed tissue of the fetus. She has been waiting in the lobby for several hours, but reports that she has continued to have pain and heavy bleeding since she passed the tissue at home. She is rolling around in the bed from discomfort. Vital signs are stable. She was given IV morphine as I entered the room.  She reports she has been taking sips of water while in the waiting area. She last ate around 7pm at home.    Past Medical History:  Diagnosis Date  . Medical history non-contributory     Past Surgical History:  Procedure Laterality Date  . CESAREAN SECTION N/A 02/20/2017   Procedure: CESAREAN SECTION;  Surgeon: Nadara Mustard, MD;  Location: [facility] ORS;  Service: Obstetrics;  Laterality: N/A;  Time of Birth: 11:[birth year] Sex: Female Wt:   . NO PAST SURGERIES      Family History  Problem Relation Age of Onset  . Diabetes Mother   . Hypertension Mother    Social History:  reports that she has never smoked. She has never used smokeless tobacco. She reports that she does not drink alcohol and does not use drugs.  Allergies: No Known Allergies  (Not in a hospital admission)   Results for orders placed or performed during the hospital encounter of 03/12/20 (from the past 48 hour(s))  CBC     Status: Abnormal   Collection Time: 03/12/20  1:30 AM  Result Value Ref Range   WBC 16.4 (H) 4.0 - 10.5 K/uL   RBC 4.28 3.87 - 5.11 MIL/uL   Hemoglobin 13.1 12.0 - 15.0 g/dL   HCT 13.2 36 - 46 %   MCV 88.8 80.0 - 100.0 fL   MCH 30.6 26.0 - 34.0 pg   MCHC 34.5 30.0 - 36.0 g/dL   RDW 44.0 10.2 - 72.5 %   Platelets 354 150 - 400 K/uL   nRBC 0.0 0.0 - 0.2 %    Comment: Performed at [facility], [address] [place], [place] [address]  Comprehensive metabolic panel     Status: None   Collection Time:  03/12/20  1:30 AM  Result Value Ref Range   Sodium 137 135 - 145 mmol/L   Potassium 3.7 3.5 - 5.1 mmol/L   Chloride 106 98 - 111 mmol/L   CO2 23 22 - 32 mmol/L   Glucose, Bld 98 70 - 99 mg/dL    Comment: Glucose reference range applies only to samples taken after fasting for at least 8 hours.   BUN 11 6 - 20 mg/dL   Creatinine, Ser 0.34 0.44 - 1.00 mg/dL   Calcium 8.9 8.9 - 74.2 mg/dL   Total Protein 7.4 6.5 - 8.1 g/dL   Albumin 3.7 3.5 - 5.0 g/dL   AST 19 15 - 41 U/L   ALT 19 0 - 44 U/L   Alkaline Phosphatase 62 38 - 126 U/L   Total Bilirubin 0.4 0.3 - 1.2 mg/dL   GFR, Estimated >59 >56 mL/min    Comment: (NOTE) Calculated using the CKD-EPI Creatinine Equation (2021)    Anion gap 8 5 - 15    Comment: Performed at [facility], [address] [place], [place] [address]  hCG, quantitative, pregnancy     Status: Abnormal   Collection Time: 03/12/20  1:30  AM  Result Value Ref Range   hCG, Beta Chain, Quant, S 6,207 (H) <5 mIU/mL    Comment:          GEST. AGE      CONC.  (mIU/mL)   <=1 WEEK        5 - 50     2 WEEKS       50 - 500     3 WEEKS       100 - 10,000     4 WEEKS     1,000 - 30,000     5 WEEKS     3,500 - 115,000   6-8 WEEKS     12,000 - 270,000    12 WEEKS     15,000 - 220,000        FEMALE AND NON-PREGNANT FEMALE:     LESS THAN 5 mIU/mL Performed at [facility], [address] [place], [place] [address]   Urinalysis, Complete w Microscopic     Status: Abnormal   Collection Time: 03/12/20  1:30 AM  Result Value Ref Range   Color, Urine YELLOW (A) YELLOW   APPearance CLEAR (A) CLEAR   Specific Gravity, Urine 1.006 1.005 - 1.030   pH 6.0 5.0 - 8.0   Glucose, UA NEGATIVE NEGATIVE mg/dL   Hgb urine dipstick LARGE (A) NEGATIVE   Bilirubin Urine NEGATIVE NEGATIVE   Ketones, ur NEGATIVE NEGATIVE mg/dL   Protein, ur NEGATIVE NEGATIVE mg/dL   Nitrite NEGATIVE NEGATIVE   Leukocytes,Ua NEGATIVE NEGATIVE   RBC / HPF >50 (H) 0 - 5 RBC/hpf   WBC,  UA NONE SEEN 0 - 5 WBC/hpf   Bacteria, UA NONE SEEN NONE SEEN   Squamous Epithelial / LPF 0-5 0 - 5   Mucus PRESENT     Comment: Performed at [facility], [address] [place], [place] [address]   US OB LESS THAN 14 WEEKS WITH OB TRANSVAGINAL  Result Date: 03/12/2020 CLINICAL DATA:  Twelve weeks pregnant with vaginal bleeding and passed products of conception. EXAM: OBSTETRIC <14 WK [place] AND TRANSVAGINAL OB US TECHNIQUE: Both transabdominal and transvaginal ultrasound examinations were performed for complete evaluation of the gestation as well as the maternal uterus, adnexal regions, and pelvic cul-de-sac. Transvaginal technique was performed to assess early pregnancy. COMPARISON:  None of this gestation FINDINGS: No visible intra or extra uterine gestational sac. Endometrial thickness is 18 mm with some heterogeneity of endometrial contents especially on transverse transvaginal images. On color Doppler images most of the signal appears localized to the myometrium, especially on the transvaginal images which have the best resolution. Symmetric normal appearance of the ovaries. No adnexal mass or pelvic fluid. IMPRESSION: 1. No visible intra or extrauterine gestation, reportedly there has been preceding spontaneous abortion. 2. 18 mm endometrial thickness. Electronically Signed   By: Marnee Spring M.D.   On: 03/12/2020 05:19    Review of Systems  Constitutional: Negative for chills and fever.  HENT: Negative for congestion, hearing loss and sinus pain.   Respiratory: Negative for cough, shortness of breath and wheezing.   Cardiovascular: Negative for chest pain, palpitations and leg swelling.  Gastrointestinal: Negative for abdominal pain, constipation, diarrhea, nausea and vomiting.  Genitourinary: Negative for dysuria, flank pain, frequency, hematuria and urgency.  Musculoskeletal: Negative for back pain.  Skin: Negative for rash.  Neurological: Negative for dizziness and  headaches.  Psychiatric/Behavioral: Negative for suicidal ideas. The patient is not nervous/anxious.     Blood pressure 140/69,  pulse 87, temperature 99 F (37.2 C), temperature source Oral, resp. rate 20, height 5\' 3"  (1.6 m), weight 95.3 kg, last menstrual period 12/20/2019, SpO2 97 %, unknown if currently breastfeeding. Physical Exam Vitals and nursing note reviewed.  Constitutional:      Appearance: She is well-developed.  HENT:     Head: Normocephalic and atraumatic.  Cardiovascular:     Rate and Rhythm: Normal rate and regular rhythm.  Pulmonary:     Effort: Pulmonary effort is normal.     Breath sounds: Normal breath sounds.  Abdominal:     General: Bowel sounds are normal.     Palpations: Abdomen is soft.  Genitourinary:    Comments: Blue chuck beneath patient 1/2 full with bright red blood No active gushing of blood or passage of clots  Musculoskeletal:        General: Normal range of motion.  Skin:    General: Skin is warm and dry.  Neurological:     Mental Status: She is alert and oriented to person, place, and time.  Psychiatric:        Behavior: Behavior normal.        Thought Content: Thought content normal.        Judgment: Judgment normal.      Assessment/Plan 29 yo with incomplete miscarriage and retained products of conception  Condolences for loss were offered.   Discussed options with the patient for medical management, observation, and surgery.  Having a dilation and evacuation of the pregnancy will result in a nearly 100% removal of the tissue. Complications such as hemorrhage, infection, uterine perforation, retained tissue or uterine scarring are possible but not common. Risks and benefits of each option discussed. Patient elected for dilation and evacuation.   The OR team was notified. COVID result is pending. Patient hemodynamically stable.  Patient is O+, no rhogam needed  37, MD 03/12/2020, 6:14 AM

## 2020-03-12 NOTE — ED Notes (Signed)
Products of conception placed in formalin jar and labeled with pt sticker.

## 2020-03-12 NOTE — ED Notes (Signed)
Third pad changed since patient's arrival to ED treatment room. Pt continues to have large amounts of dark red bleeding. Fetus placed in formalin and remains at specimen station.

## 2020-03-12 NOTE — Anesthesia Postprocedure Evaluation (Signed)
Anesthesia Post Note  Patient: Chrys Landgrebe  Procedure(s) Performed: DILATATION AND EVACUATION (N/A )  Patient location during evaluation: PACU Anesthesia Type: General Level of consciousness: awake and alert Pain management: pain level controlled Vital Signs Assessment: post-procedure vital signs reviewed and stable Respiratory status: spontaneous breathing, nonlabored ventilation, respiratory function stable and patient connected to nasal cannula oxygen Cardiovascular status: blood pressure returned to baseline and stable Postop Assessment: no apparent nausea or vomiting Anesthetic complications: no   No complications documented.   Last Vitals:  Vitals:   03/12/20 0915 03/12/20 0920  BP: 112/71 120/75  Pulse: 82 79  Resp: (!) 22 18  Temp:  36.8 C  SpO2: 100% 98%    Last Pain:  Vitals:   03/12/20 0920  TempSrc: Temporal  PainSc: 0-No pain                 Corinda Gubler

## 2020-03-13 ENCOUNTER — Encounter: Payer: BC Managed Care – PPO | Admitting: Obstetrics and Gynecology

## 2020-03-13 ENCOUNTER — Ambulatory Visit: Payer: BC Managed Care – PPO

## 2020-03-13 ENCOUNTER — Other Ambulatory Visit: Payer: BC Managed Care – PPO

## 2020-03-13 ENCOUNTER — Encounter: Payer: Self-pay | Admitting: Obstetrics and Gynecology

## 2020-03-13 LAB — SURGICAL PATHOLOGY

## 2020-03-19 ENCOUNTER — Ambulatory Visit (INDEPENDENT_AMBULATORY_CARE_PROVIDER_SITE_OTHER): Payer: BC Managed Care – PPO | Admitting: Obstetrics and Gynecology

## 2020-03-19 ENCOUNTER — Other Ambulatory Visit: Payer: Self-pay

## 2020-03-19 ENCOUNTER — Encounter: Payer: Self-pay | Admitting: Obstetrics and Gynecology

## 2020-03-19 VITALS — BP 130/74 | Ht 63.0 in | Wt 204.2 lb

## 2020-03-19 DIAGNOSIS — Z9889 Other specified postprocedural states: Secondary | ICD-10-CM

## 2020-03-19 NOTE — Patient Instructions (Signed)
[facility]  MJ Tucci [phone]   Please accept our heartfelt condolences and call us if you need an ear to listen.  Web Resources: www.[email or web address] www.acog.org [email or web address] This site has an excellent pamphlet that you can download on early pregnancy loss.  Books: Miscarriage: Women Sharing from the Heart by Marie Allen and Shelly Marks. Published by [employer]. Summarizes reactions of 100 women who experience miscarriage.  Molly's [place] by Janice Cohn. Published by [employer]. Children's book on miscarriage.  Miscarriage: A Man's Book by Rick Wheat. Published by [employer] at [phone]. Written to help men understand reactions of both parents to miscarriage.  Miscarriage: A Shattered Dream by Sherokee Ilse and Linda Hammer Burns.  I Never Held You: A book about miscarriage, healing and recovery by Ellen M. DuBois.   MISCARRIAGE OR NEONATAL LOSS.a word to parents By Gretchen Gross, MSW  For those who experience a miscarriage or fetal loss, the world has turned upside down. Regardless of whether the pregnancy was planned and wanted, or unplanned and the parents were ambivalent, there are feelings of sadness, grief and a loss of equilibrium which effects both parents. Though we allow for these feelings when other deaths occur, we are remiss in acknowledging and allowing parents to grieve their pregnancy loss or miscarriage. We wrongly equate the depth of the loss to the size of the casket.  With pregnancy loss or death before birth, there is little guidance or expectation of what is normal grief. Others, who minimize or misunderstand the loss, may expect that you "get back to normal" quickly. With pressures to overlook the impact of the loss, healthy grieving processes can get stunted, overlooked, or stopped all together. I hope that this handout will  allow you to acknowledge and express whatever loss you feel, to share with others your sorrow and will encourage you and others to take a moment to understand and experience the depth of your emotions at this time.  A baby represents so much. It may represent hope, immortality, fulfillment of dreams, or an outward sign of a loving relationship. At the same time, pregnancy may bring on anxiety, fear, and an increase in commitment and responsibility. Understandably, few pregnant parents experience only one or the other of these emotions. Despite the type of emotional response to the pregnancy, as the process continues, what is central to almost all pregnancies is that over time, the bond between parent and child grows. Each day brings an expansion of the world. What was once routine now becomes more important. Diet, lifestyle, connection to family, time commitments, relationships with friends, spouse, others.nothing is as it was. These are the normal changes that a pregnant woman and her partner experience. When a baby dies, nothing that made sense before makes any sense after. We are left emotionally raw. Often our bodies respond in ways that assure us that we are crazy, but we are not. We are grieving a life that we never fully knew, a relationship that was too short. A part of us has died Too.  Mothers and fathers become more attached to a pregnancy as each day, week and month go by. Mothers have physical contact with the pregnancy, daily reminders of the growth and change occurring. They connect by talking to their bellies, by wondering what life will be like this time next year, look for clothes, cribs, pediatricians and more. Fathers often become attached in different ways: considering buying a new car or safe car   seat, working more to earn a little more money before the baby arrives, or by reconsidering the family's financial status. However bonding occurs, it generally increases as  the baby grows. For family members, co-workers and friends, the pregnancy may be less real. They may only know of the pregnancy from a physical or emotional distance. Since many of us are so uncomfortable with death and we work hard to deny it. With a prenatal loss, the lack of contact with the unborn baby may encourage others to minimize the loss, or even suggest that it was something to feel thankful for at this time suggesting that the pain felt now would be significantly less than at any other time in relationship to this child.  That is why it feels so crazy to be in such pain when others might not understand. You might feel that you are still standing at an emotional graveside and the world wonders "when you'll be ready to go back to work" or "why you still cry. It's been four months already." Be assured that it is the rest of the world that is, in this case, acting crazy. When a living child or adult dies, we have understandings, behaviors, rituals that though painful, enable us to feel cared for, comforted, acknowledged in our loss, and encourage healing. Unfortunately, too little of this is the case when there is a miscarriage or neonatal loss.  There are many difficult dilemmas that you might face in the wake of the loss. Do you have a funeral? How do you tell people? Do you publish an obituary? What do you do with the remains, both physical and emotional? I encourage people to find a formalized way to acknowledge the loss of the pregnancy and to honor the relationship that did and does still exist and will for the rest of your life. It is your choice as to whether this happens in a funeral service, in a memorial service, in a letter to the child who you did not meet or in some manner that makes sense to you religiously, spiritually or emotionally. It is not silly to ask friends or family to participate nor is it improper to keep this intensely personal and private. Grief, however,  needs to be a public process. We must grieve among others and with our loved ones. We do not all need to feel the same amount of pain in the loss, but we need to act as a loving community to support those most hurt at this time. Healthy grieving involves a strengthening of the bonds with others who we love and who love us. Some people ask a close friend or relative to contact others and inform them of the loss to minimize the telling of the story. Others find relief in telling friends, family and colleagues about the loss, in that it reminds us that the pregnancy was real and that this pain is real. Where one connection is broken, another is reinforced and strengthened. Grieving in isolation can be detrimental. By inviting important others to a funeral, tree planting, or sunset service and by taking them up on an offer to help will help your healing.  Some people have described aspects of grief after a pregnancy loss that are quite different from those present after the loss of an adult relative or friend. Some things that might seem "weird" or "abnormal" are indeed, quite normal following this kind of loss. G.W. Davidson described her experience in the book "Understanding: Death of the Wished-for Child." "  I kept searching for something. I wasn't sure what it was. All of a sudden one day in the kitchen, I spontaneously got out my kitchen scales and started weighing fruits and vegetables. I realized I was trying to find something that had the identical weight that the baby did.I found myself weighing my rolling pin.it happened to be the identical length and weight that the baby was." This describes the process of building memories. With miscarriage and neonatal loss, there are too few memories of the pregnancy or the baby itself. Parents may not ever be able to see or hold a baby in these cases, especially in the earlier trimesters, so saving mementos and gathering and providing information  are especially important. G.W. Davidson described a parent who needs to see and feel something that had similar dimensions as the child. This is profoundly different than the grieving that happens when a 29 year old person dies, when we have years of memories, concrete possessions, photos and history. Though it is a different aspect of grief, it is normal, expected and healthy. Your grief will not make you crazy.  Also, do not be frightened if you feel this loss in a very physical manner. Your arms or chest may ache, convincing you that your heart has broken. Women have described this as a feeling of "empty arms". They also describe a similar feeling of an emptiness in their bellies, which was previously full. Some women also swear that they still feel the baby moving in their bellies. Many people report being woken to the sound of a crying baby, having vivid dreams about babies, trying to find their lost babies, or graphic dreams of injuries happening to themselves or others. Breasts may still feel as if they are filling and it may seem as if the body is unaware of the loss. These things can feel quite confusing. If you experience any of these responses, please tell someone about them. They are not a sign of insanity, they are aspects of grief. Keeping them to yourself increases the feelings of isolation and disequilibrium. Be reassured that these are normal experiences after the loss of a pregnancy.   A WORD ABOUT DIFFERENCES BETWEEN MEN'S AND WOMEN'S GRIEF  If most people do not fully understand the pain of a mother's grief through pregnancy loss, there is an even greater denial on society's part that the father is in pain. I have seen men in great pain and despair after the loss of a pregnancy and they say that nobody has ever asked them about their loss. People may only ask about their wife/partner's pain. To put it simply, one father, head bowed, shoulders heaving as he cried said  "if one more person asks me how Jane is, I'll fly apart. Don't they know I lost a child too? Don't they see the circles under my eyes, my face, my pain? Don't I count?" Couples often move through the grief process in different ways and at different times. Do not assume that your partner is better because they are wanting to take in a movie or go out to eat. This may just be a diversion, a need for a change of scenery, and a yearning for normalcy. Women are usually more comfortable crying and men find safety in action. For this reason, friction can develop between partners, when one wants the other to start behaving like they did before the loss or to cry along with them. Respect one another's process. Talk to your partner about   the specifics. If asked, "How are you today?" many people will just say "fine". Specifically relate that you have had a hard time with the holiday, or seeing a friend's new baby, and ask your partner how they felt in that moment. Sex is very often a very difficult prospect after a miscarriage or loss. It might remind a person of the conception, raise fears of another pregnancy and future children, or seem like too much pleasure to experience when one is otherwise in pain. Many men reconnect with their partner by being sexual and feel increasingly isolated when a partner does not respond. Talk. There may be a comfortable middle ground with each other. If the general patterns of the relationship shift toward uncomfortable dynamics, such as decreased communication, blaming one another, or increased absences from the home, it is important to seek counseling with a professional. This is not an easy time, you have had a loss. Be patient with yourself. Seek help and support from loved ones, friends, professionals. The grief will evolve with time and you will not always be in such immediate or raw pain. Be assured that you may never forget your pregnancy or your baby. Healing  takes time. I have compiled a list of books on grief and death that might be useful.  *Anna: A Daughter's Life. William Loizeaux, [employer], 1993. A father's account of his grief at the loss of his infant daughter. *Explaining Death to Children. Earl Grollman, [employer], 1967. A valuable guide to help adults/parents explain death to children. *When Pregnancy Fails: Families Coping with Miscarriage, Ectopic Pregnancy, Stillbirth, and Infant Death. Susan Borg and Judith Lasker, [employer], 1989. A good resource to understand pregnancy loss, including some information on support for the family. *Hour of Gold, Hour of Lead: Diaries and Letters of Anne Morrow Lindbergh. Harcourt Brace Jovanovich, 1973. Anne Lindbergh writes of the pain and loss in the wake of their young child's abduction. *A Child Dies: A Portrait of Family Grief. Joan Hagan Arnold and Penelope Bushman Gemma, the [employer], 1995. Dealing with death of unborn children, infants, and children of all ages. Revised by Nancy J. McClellan, CNM, MS November, 2008    COPING WITH A MISCARRIAGE Susan Donnis, R.N., M.A.T.  You have had a miscarriage. You have lost a pregnancy. This is undoubtedly a difficult and sad time for you. Your loss may be hard to believe and it is frustrating to know you are helpless to change anything. "This can't possibly be happening to me. Why me? Why not someone else" I promise I'll be more careful if I can only have my baby back." These are common reactions to experiencing a miscarriage. Feelings of disbelief and anger, helplessness and guilt, depression and perhaps failure, are real and understandable. You may tell yourself that the guilt you feel is irrational, unreasonable or inappropriate, but it is there. "What did I do to cause this? Why wasn't I more careful? If only I had known. Is this a punishment for something I did?" You may find yourself recounting the days or weeks  before your miscarriage, searching for clues that you feel sure you must have missed; searching for valid reasons for how or why this happened. Having something "taken away" that you cherish feels shocking and unbelievable. You ask your doctor or midwife for an explanation; friends volunteer their opinions. In many cases, your questions of "how" or "why" are not satisfactorily answered. You may have some of the above thoughts or feelings and they   may be present in different combinations, differing degrees, and in some confusion. They are understandable and part of the process of beginning to cope with a significant loss. With any death, it is healthy and essential to allow yourself to experience grief whether you are a man or woman. The grieving process is not something begun and completed in a day, a week or a month. It takes time, understanding, support and acceptance. In the case of a miscarriage, this process is often more difficult because the death is not publicly acknowledged. There is no funeral to arrange and attend, no outward recognition of a loss. This may tend to increase feelings of isolation and loneliness. The term miscarriage is used here instead of the more medically correct term spontaneous abortion to avoid confusion with elective abortion. Some couples feel they must keep their grief invisible and "get over this quickly." Most employers will freely give time off to attend a family funeral, but many do not understand the reasonable and justified request for time off after a miscarriage. Loss of a longed-for pregnancy, regardless of how early, is a significant bereavement. Men and women may react to the miscarriage differently. Men may feel more helpless and frustrated than their wives because they often assume a passive-observer role. Waiting and watching can be harder than actively participating, regardless of the outcome. Men do experience a miscarriage in spite of not  actively participating in the physical loss. People have different ways of coping with a loss and you need to choose what is most helpful to you. Too often "being strong" is looked upon as a healthy way of coping, when, in fact, repressing and ignoring very real feelings is not helpful and contributes to serious coping problems. Some feelings and questions that men and women express and ask are:  It hangs over me like a black cloud. Though I've tried, I honestly don't know how to work this through. How do I say goodbye?  I feel extremely guilty. I guess I really didn't want this pregnancy. I feel relieved that it's over, but so guilty because I'm relieved. This isn't right.  I'm fine. Life must go on and I'm a strong person. There's really nothing to grieve for anyway, is there?  I feel pressure not to be sad. So I tend to cover up a lot.  How do I respond to well meaning but frustrating platitudes like, "Try again soon, dear. Another pregnancy will help." I would like to be pregnant again, but I believe a pregnancy should happen at a time of strength, not sadness.  How do I respond to silence and awkward attempts to avoid the whole issue? (You may be the one that decides to bring up the topic. Friends, relatives and acquaintances often do not know how to respond, so they say nothing).  I'm fine until my period comes. It is such a start reminder of the part of me that is lost.  My husband/wife and I always enjoyed a good sex life. Since my miscarriage, I have a hard time allowing myself to feel loving and sensuous. After all if such a loving and pleasurable experience as intercourse started something that ended in such a tragedy, it's too scary to think it might happen again. Why doesn't my husband/wife understand?  I have to face reality. Should I just force myself to attend my friend's baby shower?  Everyone was so supportive and caring when I was in the hospital. That was four months  ago.   I can't burden others with my sadness now. Why am I still sad? I should be over this thing by now. Knowing common facts about miscarriage probably will not blot out your negative feelings - denial, anger, disappointment, sadness - but facts can hold some comfort and can help cushion the sadness and guilt. For those wanting a child, it is frustrating and intensely disappointing to experience any miscarriage. However, most pregnancies that end in the first three months are imperfect in some way and, regardless of any precaution or intervention, are incapable of surviving and growing beyond approximately 14 weeks. This fact may not ease your sense of emptiness and sadness, but it may help ease your guilt and sense of assumed responsibility. Knowing clearly that you did not cause this to happen, that you are not directly responsible of your miscarriage by what you did or did not do, can bring a sense of relief and relaxation to your anxieties or guilt. A pregnancy that ends in later months is uniquely difficult also. Your protruding abdomen, your child's movements and heartbeat, are concrete proof of your expectant parenthood. A miscarriage occurring at 6 weeks or at 6 months causes a sudden change of events and feelings. Understandably, you may feel robbed or cheated of a promise of what was to be. It's easy to think that everyone else can have children. Surely I've been tricked or badly treated, you think. A fact not commonly know is that at least one in five pregnancies end in miscarriage, which indicates you are not alone. In a room of 25 couples, the odds are that 4 have experienced a miscarriage. Of these 8 people, many have shared your experience and feelings. Of course, this is not an easy topic to talk about, therefore it can appear that miscarriages rarely happen and that you are the only one coping with this loss. Realizing you are note alone can perhaps ease some of the shock and  disbelief of "WHY Me?"  Three actions will help: 1. Giver yourself permission to be sad. It is possible to be sad without becoming chronically depressed. Set limits for yourself if you are concerned about becoming overly depressed. If the sadness begins to wash over you while at work at 2 p.m., tell yourself that you cannot deal with it then, but make an agreement with yourself that at 8 p.m. that night you will. And then do it. Many individuals report a beginning sense of control over their grief when they realize they can create appropriate and private times to be sad, alone, or with a significant other person. If you think you may be severely depressed (i.e. having difficulty getting out of bed in the morning, withdrawal from friends and relatives, insomnia, extended loss of appetite or overeating, loss of the ability to enjoy anything), this problem is probably not something you can handle yourself. It is important to seek out professional help at this time, or at any time when you are feeling particularly discouraged about your Coping.  2. Find a supportive, trusted person and talk about your feelings and your questions. Build a support system, whether it be your spouse, partner, friend, relative, colleague, clergyman, support group or professional counselor. You may still feel some pangs of sadness when attending a baby shower, celebrating a holiday, seeing other pregnant couples or passing the date of your expected delivery, but once you work through your loss, coping with your situation will become easier.  3. Also part of the process of life   itself, is understanding and accepting incongruent or seemingly contradictory feelings. As one insightful women expressed, "Being happy for a friend who has just had a baby (or twins!) is a genuine feeling, but also resentment, anger and frustration are there, all wrapped into one package;" two apparently conflicting feelings, but both can  exist, be recognized, and accepted.  A miscarriage is an unexpected, often unexplained, death; with any loss you need to give yourself permission to grieve, to be angry, sad or relieved. There is no one appropriate reaction. Acknowledging your feelings that are real and understandable, though they may not be logical or rational, is part of the grieving process. Allowing the grieving process to happen is a positive way of coping and leads to health resolve and strength to look ahead. There may be times when you may not be able to put words to your feelings, but that does not need to stop you from seeking out and talking with an understanding person. Building your own support system will help you regain confidence and strength within yourself.  RECOMMENDED READING The following materials are all available through [employer], [address], [place] [address]; phone [phone]. For Adults: Empty Arms by Sherokee Ilse Empty Cradle, Broken Heart by Deborah Davis Ended Beginnings by C. Panuthos & C. Romeo Miscarriage a [employer] Resource Miscarriage - A Shattered Dream by Sherokee Ilse Newborn Death a [employer] Resource Still To Be Born by Perinatal Loss When a Baby Dies by M.J. Church et al (TCF) When Hello Means Goodbye by P. Schwiebert and P. Kirk When Pregnancy Fails by S. Borg & J. Lasker For Helping Other Children How Do We Tell the Children? by Shaefer & Lyons No New Baby by Marilyn Gryte Talking About Death by Earl Grollman Where's Jess?? A [employer] Resource Other helpful materials available locally at bookstores: The Fall of Freddy the Leaf by Leo Buscaglia, PhD, [employer] (for children) When Goodbye is Forever - Learning to Live Again After the Loss of a Child by John Bramblett, [employer]   

## 2020-03-19 NOTE — Progress Notes (Signed)
Pt states she had a D&C last Thursday.

## 2020-03-19 NOTE — Progress Notes (Signed)
  Postoperative Follow-up Patient presents post op from D&E for incomplete AB, 1 week ago.  Subjective: Patient reports some improvement in her preop symptoms. Eating a regular diet without difficulty. Pain is controlled without any medications.  Activity: normal activities of daily living. Patient reports additional symptom's since surgery of None.  Objective: BP 130/74   Ht 5\' 3"  (1.6 m)   Wt 204 lb 3.2 oz (92.6 kg)   LMP 12/20/2019   Breastfeeding Unknown   BMI 36.17 kg/m  Physical Exam Constitutional:      Appearance: She is well-developed.  Genitourinary:     Vagina and uterus normal.     No lesions in the vagina.     No cervical motion tenderness.     No right or left adnexal mass present.  HENT:     Head: Normocephalic and atraumatic.  Neck:     Thyroid: No thyromegaly.  Cardiovascular:     Rate and Rhythm: Normal rate and regular rhythm.     Heart sounds: Normal heart sounds.  Pulmonary:     Effort: Pulmonary effort is normal.     Breath sounds: Normal breath sounds.  Chest:     Breasts:        Right: No inverted nipple, mass, nipple discharge or skin change.        Left: No inverted nipple, mass, nipple discharge or skin change.  Abdominal:     General: Bowel sounds are normal. There is no distension.     Palpations: Abdomen is soft. There is no mass.  Musculoskeletal:     Cervical back: Neck supple.  Neurological:     Mental Status: She is alert and oriented to person, place, and time.  Skin:    General: Skin is warm and dry.  Psychiatric:        Behavior: Behavior normal.        Thought Content: Thought content normal.        Judgment: Judgment normal.  Vitals reviewed.     Assessment: s/p :  D&E stable  Plan: Patient has done well after surgery with no apparent complications.  I have discussed the post-operative course to date, and the expected progress moving forward.  The patient understands what complications to be concerned about.  I will see  the patient in routine follow up, or sooner if needed.    Activity plan: No restriction. Pelvic rest for 1 more week.  Galadriel Shroff R Kaspar Albornoz 03/19/2020, 3:42 PM

## 2020-06-02 ENCOUNTER — Other Ambulatory Visit: Payer: Self-pay

## 2020-06-02 ENCOUNTER — Other Ambulatory Visit (HOSPITAL_COMMUNITY)
Admission: RE | Admit: 2020-06-02 | Discharge: 2020-06-02 | Disposition: A | Discharge status: Home / Self Care | Payer: BC Managed Care – PPO | Source: Ambulatory Visit | Attending: Obstetrics & Gynecology | Admitting: Obstetrics & Gynecology

## 2020-06-02 ENCOUNTER — Ambulatory Visit (INDEPENDENT_AMBULATORY_CARE_PROVIDER_SITE_OTHER): Payer: BC Managed Care – PPO | Admitting: Obstetrics & Gynecology

## 2020-06-02 ENCOUNTER — Encounter: Payer: Self-pay | Admitting: Obstetrics & Gynecology

## 2020-06-02 VITALS — BP 100/70 | Wt 214.0 lb

## 2020-06-02 DIAGNOSIS — Z0283 Encounter for blood-alcohol and blood-drug test: Secondary | ICD-10-CM | POA: Diagnosis not present

## 2020-06-02 DIAGNOSIS — Z98891 History of uterine scar from previous surgery: Secondary | ICD-10-CM

## 2020-06-02 DIAGNOSIS — Z3401 Encounter for supervision of normal first pregnancy, first trimester: Secondary | ICD-10-CM | POA: Diagnosis not present

## 2020-06-02 DIAGNOSIS — O0991 Supervision of high risk pregnancy, unspecified, first trimester: Secondary | ICD-10-CM

## 2020-06-02 DIAGNOSIS — Z369 Encounter for antenatal screening, unspecified: Secondary | ICD-10-CM | POA: Diagnosis not present

## 2020-06-02 DIAGNOSIS — Z3481 Encounter for supervision of other normal pregnancy, first trimester: Secondary | ICD-10-CM | POA: Insufficient documentation

## 2020-06-02 DIAGNOSIS — Z3491 Encounter for supervision of normal pregnancy, unspecified, first trimester: Secondary | ICD-10-CM | POA: Diagnosis not present

## 2020-06-02 DIAGNOSIS — Z3A01 Less than 8 weeks gestation of pregnancy: Secondary | ICD-10-CM | POA: Diagnosis not present

## 2020-06-02 DIAGNOSIS — N926 Irregular menstruation, unspecified: Secondary | ICD-10-CM

## 2020-06-02 LAB — POCT URINALYSIS DIPSTICK OB
Glucose, UA: NEGATIVE
POC,PROTEIN,UA: NEGATIVE

## 2020-06-02 LAB — POCT URINE PREGNANCY: Preg Test, Ur: POSITIVE — AB

## 2020-06-02 LAB — OB RESULTS CONSOLE GC/CHLAMYDIA: Gonorrhea: NEGATIVE

## 2020-06-02 NOTE — Addendum Note (Signed)
Addended by: Nadara Mustard on: 06/02/2020 04:34 PM   Modules accepted: Orders

## 2020-06-02 NOTE — Patient Instructions (Signed)
Thank you for choosing [facility]. As part of our ongoing efforts to improve patient experience, we would appreciate your feedback. Please fill out the short survey that you will receive by mail or MyChart. Your opinion is important to us! -Dr Clay Solum  Obstetrics: Normal and Problem Pregnancies (7th ed., pp. 102-121). [place], [place]: Elsevier."> Textbook of Family Medicine (9th ed., pp. 365-410). [place], [place]: Elsevier [employer].">  First Trimester of Pregnancy  The first trimester of pregnancy starts on the first day of your last menstrual period until the end of week 12. This is months 1 through 3 of pregnancy. A week after a sperm fertilizes an egg, the egg will implant into the wall of the uterus and begin to develop into a baby. By the end of 12 weeks, all the baby's organs will be formed and the baby will be 2-3 inches in size. Body changes during your first trimester Your body goes through many changes during pregnancy. The changes vary and generally return to normal after your baby is born. Physical changes  You may gain or lose weight.  Your breasts may begin to grow larger and become tender. The tissue that surrounds your nipples (areola) may become darker.  Dark spots or blotches (chloasma or mask of pregnancy) may develop on your face.  You may have changes in your hair. These can include thickening or thinning of your hair or changes in texture. Health changes  You may feel nauseous, and you may vomit.  You may have heartburn.  You may develop headaches.  You may develop constipation.  Your gums may bleed and may be sensitive to brushing and flossing. Other changes  You may tire easily.  You may urinate more often.  Your menstrual periods will stop.  You may have a loss of appetite.  You may develop cravings for certain kinds of food.  You may have changes in your emotions from day to day.  You may have more vivid and strange dreams. Follow these  instructions at home: Medicines  Follow your health care provider's instructions regarding medicine use. Specific medicines may be either safe or unsafe to take during pregnancy. Do not take any medicines unless told to by your health care provider.  Take a prenatal vitamin that contains at least 600 micrograms (mcg) of folic acid. Eating and drinking  Eat a healthy diet that includes fresh fruits and vegetables, whole grains, good sources of protein such as meat, eggs, or tofu, and low-fat dairy products.  Avoid raw meat and unpasteurized juice, milk, and cheese. These carry germs that can harm you and your baby.  If you feel nauseous or you vomit: ? Eat 4 or 5 small meals a day instead of 3 large meals. ? Try eating a few soda crackers. ? Drink liquids between meals instead of during meals.  You may need to take these actions to prevent or treat constipation: ? Drink enough fluid to keep your urine pale yellow. ? Eat foods that are high in fiber, such as beans, whole grains, and fresh fruits and vegetables. ? Limit foods that are high in fat and processed sugars, such as fried or sweet foods. Activity  Exercise only as directed by your health care provider. Most people can continue their usual exercise routine during pregnancy. Try to exercise for 30 minutes at least 5 days a week.  Stop exercising if you develop pain or cramping in the lower abdomen or lower back.  Avoid exercising if it is very hot   hot or humid or if you are at high altitude.  Avoid heavy lifting.  If you choose to, you may have sex unless your health care provider tells you not to. Relieving pain and discomfort  Wear a good support bra to relieve breast tenderness.  Rest with your legs elevated if you have leg cramps or low back pain.  If you develop bulging veins (varicose veins) in your legs: ? Wear support hose as told by your health care provider. ? Elevate your feet for 15 minutes, 3-4 times a  day. ? Limit salt in your diet. Safety  Wear your seat belt at all times when driving or riding in a car.  Talk with your health care provider if someone is verbally or physically abusive to you.  Talk with your health care provider if you are feeling sad or have thoughts of hurting yourself. Lifestyle  Do not use hot tubs, steam rooms, or saunas.  Do not douche. Do not use tampons or scented sanitary pads.  Do not use herbal remedies, alcohol, illegal drugs, or medicines that are not approved by your health care provider. Chemicals in these products can harm your baby.  Do not use any products that contain nicotine or tobacco, such as cigarettes, e-cigarettes, and chewing tobacco. If you need help quitting, ask your health care provider.  Avoid cat litter boxes and soil used by cats. These carry germs that can cause birth defects in the baby and possibly loss of the unborn baby (fetus) by miscarriage or stillbirth. General instructions  During routine prenatal visits in the first trimester, your health care provider will do a physical exam, perform necessary tests, and ask you how things are going. Keep all follow-up visits. This is important.  Ask for help if you have counseling or nutritional needs during pregnancy. Your health care provider can offer advice or refer you to specialists for help with various needs.  Schedule a dentist appointment. At home, brush your teeth with a soft toothbrush. Floss gently.  Write down your questions. Take them to your prenatal visits. Where to find more information  American Pregnancy Association: americanpregnancy.org  [employer]st[employer]ians and Gynecologists: https://www.todd-brady.net/  Office on [employer] Health: MightyReward.co.nz Contact a health care provider if you have:  Dizziness.  A fever.  Mild pelvic cramps, pelvic pressure, or nagging pain in the abdominal area.  Nausea, vomiting, or diarrhea  that lasts for 24 hours or longer.  A bad-smelling vaginal discharge.  Pain when you urinate.  Known exposure to a contagious illness, such as chickenpox, measles, Zika virus, HIV, or hepatitis. Get help right away if you have:  Spotting or bleeding from your vagina.  Severe abdominal cramping or pain.  Shortness of breath or chest pain.  Any kind of trauma, such as from a fall or a car crash.  New or increased pain, swelling, or redness in an arm or leg. Summary  The first trimester of pregnancy starts on the first day of your last menstrual period until the end of week 12 (months 1 through 3).  Eating 4 or 5 small meals a day rather than 3 large meals may help to relieve nausea and vomiting.  Do not use any products that contain nicotine or tobacco, such as cigarettes, e-cigarettes, and chewing tobacco. If you need help quitting, ask your health care provider.  Keep all follow-up visits. This is important. This information is not intended to replace advice given to you by your health care provider.  Make sure you discuss any questions you have with your health care provider. Document Revised: 10/09/2019 Document Reviewed: 08/15/2019 Elsevier Patient Education  2021 [employer].

## 2020-06-02 NOTE — Progress Notes (Signed)
06/02/2020   Chief Complaint: Missed period  Transfer of Care Patient: no  History of Present Illness: Rebecca Gomez is a 30 y.o. G3P1011 [redacted]w[redacted]d based on Patient's last menstrual period was 04/13/2020. with an Estimated Date of Delivery: 01/18/21, with the above CC.   Her periods were: one period (late Nov) after having D&C for miscarriage in early Nov She was using no method when she conceived.  She has Positive signs or symptoms of nausea/vomiting of pregnancy. She has Negative signs or symptoms of miscarriage or preterm labor She identifies Negative Zika risk factors for her and her partner On any different medications around the time she conceived/early pregnancy: No  History of varicella: Yes   ROS: A 12-point review of systems was performed and negative, except as stated in the above HPI.  OBGYN History: As per HPI. OB History  Gravida Para Term Preterm AB Living  3 1 1   1 1   SAB IAB Ectopic Multiple Live Births  1     0 1    # Outcome Date GA Lbr Len/2nd Weight Sex Delivery Anes PTL Lv  3 Current           2 Term 02/20/17 [redacted]w[redacted]d  7 lb 4.4 oz (3.3 kg) F CS-LTranv EPI, Gen  LIV  1 SAB             Any issues with any prior pregnancies: CS and preeclampsia at term Any prior children are healthy, doing well, without any problems or issues: yes History of pap smears: Yes. Last pap smear 08/2019. Abnormal: no  History of STIs: No   Past Medical History: Past Medical History:  Diagnosis Date  . Medical history non-contributory     Past Surgical History: Past Surgical History:  Procedure Laterality Date  . CESAREAN SECTION N/A 02/20/2017   Procedure: CESAREAN SECTION;  Surgeon: 04/22/2017, MD;  Location: [facility] ORS;  Service: Obstetrics;  Laterality: N/A;  Time of Birth: 11:45 Sex: Female Wt:   . DILATION AND EVACUATION N/A 03/12/2020   Procedure: DILATATION AND EVACUATION;  Surgeon: 03/14/2020, MD;  Location: [facility] ORS;  Service: Gynecology;  Laterality: N/A;  .  NO PAST SURGERIES      Family History:  Family History  Problem Relation Age of Onset  . Diabetes Mother   . Hypertension Mother    She denies any female cancers, bleeding or blood clotting disorders.  She denies any history of mental retardation, birth defects or genetic disorders in her or the FOB's history  Social History:  Social History   Socioeconomic History  . Marital status: Married    Spouse name: Not on file  . Number of children: Not on file  . Years of education: Not on file  . Highest education level: Not on file  Occupational History  . Not on file  Tobacco Use  . Smoking status: Never Smoker  . Smokeless tobacco: Never Used  Vaping Use  . Vaping Use: Never used  Substance and Sexual Activity  . Alcohol use: No  . Drug use: No  . Sexual activity: Yes    Birth control/protection: None  Other Topics Concern  . Not on file  Social History Narrative  . Not on file   Social Determinants of Health   Financial Resource Strain: Not on file  Food Insecurity: Not on file  Transportation Needs: Not on file  Physical Activity: Not on file  Stress: Not on file  Social Connections: Not on file  Intimate Partner Violence: Not on file   Any pets in the household: no  Allergy: No Known Allergies  Current Outpatient Medications:  Current Outpatient Medications:  .  acetaminophen (TYLENOL) 325 MG tablet, Take 2 tablets (650 mg total) by mouth every 4 (four) hours as needed., Disp: 30 tablet, Rfl: 0 .  HYDROcodone-acetaminophen (NORCO/VICODIN) 5-325 MG tablet, Take 1-2 tablets by mouth every 6 (six) hours as needed for moderate pain., Disp: 5 tablet, Rfl: 0 .  ibuprofen (ADVIL) 800 MG tablet, Take 1 tablet (800 mg total) by mouth every 8 (eight) hours as needed for cramping., Disp: 30 tablet, Rfl: 0 .  Prenatal Vit-Fe Fumarate-FA (MULTIVITAMIN-PRENATAL) 27-0.8 MG TABS tablet, Take 1 tablet by mouth daily at 12 noon., Disp: , Rfl:    Physical Exam:   BP  100/70   Wt 214 lb (97.1 kg)   LMP 04/13/2020   BMI 37.91 kg/m  Body mass index is 37.91 kg/m. Constitutional: Well nourished, well developed female in no acute distress.  Neck:  Supple, normal appearance, and no thyromegaly  Cardiovascular: S1, S2 normal, no murmur, rub or gallop, regular rate and rhythm Respiratory:  Clear to auscultation bilateral. Normal respiratory effort Abdomen: positive bowel sounds and no masses, hernias; diffusely non tender to palpation, non distended Breasts: breasts appear normal, no suspicious masses, no skin or nipple changes or axillary nodes. Neuro/Psych:  Normal mood and affect.  Skin:  Warm and dry.  Lymphatic:  No inguinal lymphadenopathy.   Pelvic exam: is not limited by body habitus EGBUS: within normal limits, Vagina: within normal limits and with no blood in the vault, Cervix: normal appearing cervix without discharge or lesions, closed/long/high, Uterus:  enlarged: 6 weeks, and Adnexa:  normal adnexa  Assessment: Rebecca Gomez is a 30 y.o. G3P1011 [redacted]w[redacted]d based on Patient's last menstrual period was 04/13/2020. with an Estimated Date of Delivery: 01/18/21,  for prenatal care.  Plan:  1) Avoid alcoholic beverages. 2) Patient encouraged not to smoke.  3) Discontinue the use of all non-medicinal drugs and chemicals.  4) Take prenatal vitamins daily.  5) Seatbelt use advised 6) Nutrition, food safety (fish, cheese advisories, and high nitrite foods) and exercise discussed. 7) Hospital and practice style delivering at [facility] discussed  8) Patient is asked about travel to areas at risk for the Zika virus, and counseled to avoid travel and exposure to mosquitoes or sexual partners who may have themselves been exposed to the virus. Testing is discussed, and will be ordered as appropriate.  9) Childbirth classes at [facility] advised 10) Genetic Screening, such as with 1st Trimester Screening, cell free fetal DNA, AFP testing, and Ultrasound, as well as with  amniocentesis and CVS as appropriate, is discussed with patient. She plans to have genetic testing this pregnancy. 11) Plan [place] soon and labs (w glucola and NIPT thereafter 10 weeks) 12) Desires TOLAC  Problem list reviewed and updated.  Annamarie Major, MD, [facility], [facility] 06/02/2020  2:43 PM

## 2020-06-04 LAB — CERVICOVAGINAL ANCILLARY ONLY
Chlamydia: NEGATIVE
Comment: NEGATIVE
Comment: NEGATIVE
Comment: NORMAL
Neisseria Gonorrhea: NEGATIVE
Trichomonas: NEGATIVE

## 2020-06-04 LAB — URINE CULTURE: Organism ID, Bacteria: NO GROWTH

## 2020-06-09 ENCOUNTER — Other Ambulatory Visit: Payer: Self-pay | Admitting: Obstetrics & Gynecology

## 2020-06-09 ENCOUNTER — Other Ambulatory Visit: Payer: Self-pay

## 2020-06-09 ENCOUNTER — Encounter: Payer: Self-pay | Admitting: Obstetrics and Gynecology

## 2020-06-09 ENCOUNTER — Ambulatory Visit (INDEPENDENT_AMBULATORY_CARE_PROVIDER_SITE_OTHER): Payer: BC Managed Care – PPO

## 2020-06-09 ENCOUNTER — Ambulatory Visit (INDEPENDENT_AMBULATORY_CARE_PROVIDER_SITE_OTHER): Payer: BC Managed Care – PPO | Admitting: Obstetrics and Gynecology

## 2020-06-09 VITALS — BP 120/72 | Wt 214.0 lb

## 2020-06-09 DIAGNOSIS — N926 Irregular menstruation, unspecified: Secondary | ICD-10-CM | POA: Diagnosis not present

## 2020-06-09 DIAGNOSIS — Z3401 Encounter for supervision of normal first pregnancy, first trimester: Secondary | ICD-10-CM | POA: Insufficient documentation

## 2020-06-09 DIAGNOSIS — O0991 Supervision of high risk pregnancy, unspecified, first trimester: Secondary | ICD-10-CM | POA: Insufficient documentation

## 2020-06-09 DIAGNOSIS — Z3A08 8 weeks gestation of pregnancy: Secondary | ICD-10-CM

## 2020-06-09 DIAGNOSIS — O0993 Supervision of high risk pregnancy, unspecified, third trimester: Secondary | ICD-10-CM | POA: Insufficient documentation

## 2020-06-09 LAB — TOXASSURE SELECT 13 (MW), URINE

## 2020-06-09 NOTE — Progress Notes (Signed)
Routine Prenatal Care Visit  Subjective  Rebecca Gomez is a 30 y.o. G3P1011 at [redacted]w[redacted]d being seen today for ongoing prenatal care.  She is currently monitored for the following issues for this high-risk pregnancy and has History of cesarean delivery; High-risk pregnancy, first trimester; Miscarriage; and Retained products of conception after miscarriage on their problem list.  ----------------------------------------------------------------------------------- Patient reports no complaints.    . Vag. Bleeding: None.   . Denies leaking of fluid.  ----------------------------------------------------------------------------------- The following portions of the patient's history were reviewed and updated as appropriate: allergies, current medications, past family history, past medical history, past social history, past surgical history and problem list. Problem list updated.   Objective  Blood pressure 120/72, weight 214 lb (97.1 kg), last menstrual period 04/13/2020, unknown if currently breastfeeding. Pregravid weight 214 lb (97.1 kg) Total Weight Gain 0 lb (0 kg) Urinalysis:      Fetal Status: Fetal Heart Rate (bpm): 173 ([place])         Dating [place]:  Singleton intrauterine pregnancy is visualized with a CRL consistent with [redacted]w[redacted]d gestation, giving an (U/S) EDD of 01/18/2021. The (U/S) EDD is consistent with the clinically established EDD of 01/18/2021. FHR: 173 BPM CRL measurement: 16.8 mm Yolk sac is visualized and appears normal. Amnion: visualized and appears normal  Right Ovary is normal in appearance. Left Ovary not seen. Corpus luteal cyst:  Right ovary Survey of the adnexa demonstrates no adnexal masses. There is no free peritoneal fluid in the cul de sac.   General:  Alert, oriented and cooperative. Patient is in no acute distress.  Skin: Skin is warm and dry. No rash noted.   Cardiovascular: Normal heart rate noted  Respiratory: Normal respiratory effort, no problems with  respiration noted  Abdomen: Soft, gravid, appropriate for gestational age. Pain/Pressure: Absent     Pelvic:  Cervical exam deferred        Extremities: Normal range of motion.     ental Status: Normal mood and affect. Normal behavior. Normal judgment and thought content.     Assessment   30 y.o. G3P1011 at [redacted]w[redacted]d by  01/18/2021, by Last Menstrual Period presenting for routine prenatal visit     Plan   [email or web address]  Problems (from 04/13/20 to present)    Problem Noted Resolved   High-risk pregnancy, first trimester 06/09/2020 by Nadara Mustard, MD No   Overview Addendum 06/09/2020  4:40 PM by Zipporah Plants, CNM     Nursing Staff Provider  Office Location  [facility] Dating   LMP = 8w [place]  Language  English Anatomy US    Flu Vaccine   Genetic Screen  NIPS:   AFP:   First Screen:    TDaP vaccine    Hgb A1C or  GTT Early : Third trimester :   Rhogam     LAB RESULTS   Feeding Plan  Blood Type --/--/O POS (10/28 0945)   Contraception  Antibody NEG (10/28 0945)  Circumcision  Rubella   Pediatrician   RPR   Support Person  HBsAg   Prenatal Classes  HIV     Varicella   BTL Consent  GBS  (For PCN allergy, check sensitivities)        VBAC Consent  Desires TOLAC Pap      Hgb Electro      CF      SMA               Previous Version      -Reviewed  dating [place] with patient - EDD consistent with previously establish EDD based on LMP  First trimester precautions including but not limited to vaginal bleeding, contractions, leaking of fluid and fetal movement were reviewed in detail with the patient.    Return in about 2 weeks (around 06/23/2020) for ROB with 1h GTT/labs/NIPS.  Zipporah Plants, CNM, MSN [facility], [facility] 06/09/2020, 8:27 PM

## 2020-06-16 ENCOUNTER — Telehealth: Payer: Self-pay

## 2020-06-16 NOTE — Telephone Encounter (Signed)
Pt states she would like to have some Zofran called in [facility] in [place] for her nausea. Pt states this is happening mostly at night. She says she did vomit 2 nights ago.

## 2020-06-16 NOTE — Telephone Encounter (Signed)
Patient requesting rx for nausea. DJ#[phone]

## 2020-06-18 ENCOUNTER — Other Ambulatory Visit: Payer: Self-pay | Admitting: Obstetrics & Gynecology

## 2020-06-18 MED ORDER — ONDANSETRON 4 MG PO TBDP: 4.0000 mg | ORAL_TABLET | Freq: Four times a day (QID) | ORAL | 0 refills | Status: DC | PRN

## 2020-06-18 NOTE — Telephone Encounter (Signed)
Pt aware.

## 2020-06-18 NOTE — Telephone Encounter (Signed)
Let her know Rx has been called in for nausea medicine ZOFRAN

## 2020-06-24 ENCOUNTER — Encounter: Payer: Self-pay | Admitting: Obstetrics

## 2020-06-24 ENCOUNTER — Ambulatory Visit (INDEPENDENT_AMBULATORY_CARE_PROVIDER_SITE_OTHER): Payer: BC Managed Care – PPO | Admitting: Obstetrics

## 2020-06-24 ENCOUNTER — Other Ambulatory Visit: Payer: Self-pay

## 2020-06-24 ENCOUNTER — Encounter: Payer: BC Managed Care – PPO | Admitting: Obstetrics & Gynecology

## 2020-06-24 VITALS — BP 120/80 | Wt 213.0 lb

## 2020-06-24 DIAGNOSIS — Z1379 Encounter for other screening for genetic and chromosomal anomalies: Secondary | ICD-10-CM | POA: Diagnosis not present

## 2020-06-24 DIAGNOSIS — O0991 Supervision of high risk pregnancy, unspecified, first trimester: Secondary | ICD-10-CM

## 2020-06-24 DIAGNOSIS — O99210 Obesity complicating pregnancy, unspecified trimester: Secondary | ICD-10-CM

## 2020-06-24 DIAGNOSIS — Z3A10 10 weeks gestation of pregnancy: Secondary | ICD-10-CM | POA: Diagnosis not present

## 2020-06-24 DIAGNOSIS — Z3480 Encounter for supervision of other normal pregnancy, unspecified trimester: Secondary | ICD-10-CM | POA: Diagnosis not present

## 2020-06-24 LAB — OB RESULTS CONSOLE VARICELLA ZOSTER ANTIBODY, IGG: Varicella: IMMUNE

## 2020-06-24 NOTE — Progress Notes (Signed)
Routine Prenatal Care Visit  Subjective  Rebecca Gomez is a 30 y.o. G3P1011 at [redacted]w[redacted]d being seen today for ongoing prenatal care.  She is currently monitored for the following issues for this high-risk pregnancy and has History of cesarean delivery and High-risk pregnancy, first trimester on their problem list.  ----------------------------------------------------------------------------------- Patient reports no bleeding, no cramping and and she has had some nausea.Trying not to use much Zofran. did not kow about the early GTT being done this MA with her NOB labs. Will do the GTT in 4 weeks..    Lockie Pares. Bleeding: None.   . Leaking Fluid denies.  ----------------------------------------------------------------------------------- The following portions of the patient's history were reviewed and updated as appropriate: allergies, current medications, past family history, past medical history, past social history, past surgical history and problem list. Problem list updated.  Objective  Blood pressure 120/80, weight 213 lb (96.6 kg), last menstrual period 04/13/2020, unknown if currently breastfeeding. Pregravid weight 214 lb (97.1 kg) Total Weight Gain -1 lb (-0.454 kg) Urinalysis: Urine Protein    Urine Glucose    Fetal Status:           General:  Alert, oriented and cooperative. Patient is in no acute distress.  Skin: Skin is warm and dry. No rash noted.   Cardiovascular: Normal heart rate noted  Respiratory: Normal respiratory effort, no problems with respiration noted  Abdomen: Soft, gravid, appropriate for gestational age. Pain/Pressure: Absent     Pelvic:  Cervical exam deferred        Extremities: Normal range of motion.     Mental Status: Normal mood and affect. Normal behavior. Normal judgment and thought content.   Assessment   30 y.o. G3P1011 at [redacted]w[redacted]d by  01/18/2021, by Last Menstrual Period presenting for routine prenatal visit  Plan   [email or web address]  Problems (from 04/13/20 to  present)    Problem Noted Resolved   High-risk pregnancy, first trimester 06/09/2020 by Nadara Mustard, MD No   Overview Addendum 06/09/2020  4:40 PM by Zipporah Plants, CNM     Nursing Staff Provider  Office Location  [facility] Dating   LMP = 8w [place]  Language  English Anatomy US    Flu Vaccine   Genetic Screen  NIPS:   AFP:   First Screen:    TDaP vaccine    Hgb A1C or  GTT Early : Third trimester :   Rhogam     LAB RESULTS   Feeding Plan  Blood Type --/--/O POS (10/28 0945)   Contraception  Antibody NEG (10/28 0945)  Circumcision  Rubella   Pediatrician   RPR   Support Person  HBsAg   Prenatal Classes  HIV     Varicella   BTL Consent  GBS  (For PCN allergy, check sensitivities)        VBAC Consent  Desires TOLAC Pap      Hgb Electro      CF      SMA               Previous Version       Preterm labor symptoms and general obstetric precautions including but not limited to vaginal bleeding, contractions, leaking of fluid and fetal movement were reviewed in detail with the patient. Please refer to After Visit Summary for other counseling recommendations.  NOB labs, Inheritest and MaternT today   Return in about 4 weeks (around 07/22/2020) for return OB, 1hr GTT.  Mirna Mires, CNM  06/24/2020 11:25 AM

## 2020-06-25 LAB — RPR+RH+ABO+RUB AB+AB SCR+CB...
Antibody Screen: NEGATIVE
HIV Screen 4th Generation wRfx: NONREACTIVE
Hematocrit: 36.7 % (ref 34.0–46.6)
Hemoglobin: 12.1 g/dL (ref 11.1–15.9)
Hepatitis B Surface Ag: NEGATIVE
MCH: 27.7 pg (ref 26.6–33.0)
MCHC: 33 g/dL (ref 31.5–35.7)
MCV: 84 fL (ref 79–97)
Platelets: 391 10*3/uL (ref 150–450)
RBC: 4.37 x10E6/uL (ref 3.77–5.28)
RDW: 14.7 % (ref 11.7–15.4)
RPR Ser Ql: NONREACTIVE
Rh Factor: POSITIVE
Rubella Antibodies, IGG: 7.42 index (ref >0.99)
Varicella zoster IgG: 218 index (ref >165)
WBC: 12.4 10*3/uL — ABNORMAL HIGH (ref 3.4–10.8)

## 2020-06-26 LAB — URINE CULTURE: Organism ID, Bacteria: NO GROWTH

## 2020-06-29 LAB — MATERNIT 21 PLUS CORE, BLOOD
Fetal Fraction: 6
Result (T21): NEGATIVE
Trisomy 13 (Patau syndrome): NEGATIVE
Trisomy 18 (Edwards syndrome): NEGATIVE
Trisomy 21 (Down syndrome): NEGATIVE

## 2020-07-06 ENCOUNTER — Other Ambulatory Visit: Payer: Self-pay | Admitting: Obstetrics & Gynecology

## 2020-07-09 LAB — INHERITEST CORE(CF97,SMA,FRAX)

## 2020-07-22 ENCOUNTER — Ambulatory Visit (INDEPENDENT_AMBULATORY_CARE_PROVIDER_SITE_OTHER): Payer: BC Managed Care – PPO | Admitting: Obstetrics & Gynecology

## 2020-07-22 ENCOUNTER — Other Ambulatory Visit: Payer: BC Managed Care – PPO

## 2020-07-22 ENCOUNTER — Encounter: Payer: Self-pay | Admitting: Obstetrics & Gynecology

## 2020-07-22 ENCOUNTER — Other Ambulatory Visit: Payer: Self-pay

## 2020-07-22 VITALS — BP 120/80 | Wt 207.0 lb

## 2020-07-22 DIAGNOSIS — O99210 Obesity complicating pregnancy, unspecified trimester: Secondary | ICD-10-CM | POA: Diagnosis not present

## 2020-07-22 DIAGNOSIS — O0991 Supervision of high risk pregnancy, unspecified, first trimester: Secondary | ICD-10-CM | POA: Diagnosis not present

## 2020-07-22 DIAGNOSIS — Z3689 Encounter for other specified antenatal screening: Secondary | ICD-10-CM

## 2020-07-22 DIAGNOSIS — O0992 Supervision of high risk pregnancy, unspecified, second trimester: Secondary | ICD-10-CM

## 2020-07-22 DIAGNOSIS — Z3A14 14 weeks gestation of pregnancy: Secondary | ICD-10-CM

## 2020-07-22 NOTE — Progress Notes (Signed)
  Subjective  No nausea, does have GERD No pain or bleeding  Objective  BP 120/80   Wt 207 lb (93.9 kg)   LMP 04/13/2020   BMI 36.67 kg/m  General: NAD Pumonary: no increased work of breathing Abdomen: gravid, non-tender Extremities: no edema Psychiatric: mood appropriate, affect full  Assessment  30 y.o. G3P1011 at [redacted]w[redacted]d by  01/18/2021, by Last Menstrual Period presenting for routine prenatal visit  Plan   Problem List Items Addressed This Visit      Other   High-risk pregnancy, first trimester    Other Visit Diagnoses    [redacted] weeks gestation of pregnancy    -  Primary      pregnancy3  Problems (from 04/13/20 to present)    Problem Noted Resolved   High-risk pregnancy, first trimester 06/09/2020 by Nadara Mustard, MD No   Overview Addendum 07/22/2020  8:51 AM by Nadara Mustard, MD     Nursing Staff Provider  Office Location  [facility] Dating   LMP = 8w [place]  Language  English Anatomy US    Flu Vaccine  02/2020 Genetic Screen  NIPS:MaternT- neg- female  TDaP vaccine    Hgb A1C or  GTT Early : Third trimester :   Rhogam  n/a   LAB RESULTS   Feeding Plan  Blood Type O/Positive/-- (02/09 1130)   Contraception  Antibody Negative (02/09 1130)  Circumcision  Rubella Immune  Pediatrician   RPR neg  Support Person  HBsAg neg  Prenatal Classes  HIV neg    Varicella Immune  BTL Consent  GBS  (For PCN allergy, check sensitivities)        VBAC Consent  Desires TOLAC Pap  08/22/19 nml, repeat pp    Hgb Electro  n/a    CF Neg     SMA Neg            PNV  [place] nv  Annamarie Major, MD, [facility], [facility] 07/22/2020  9:01 AM

## 2020-07-22 NOTE — Addendum Note (Signed)
Addended by: Nadara Mustard on: 07/22/2020 09:03 AM   Modules accepted: Orders

## 2020-07-22 NOTE — Patient Instructions (Signed)
Thank you for choosing [facility]. As part of our ongoing efforts to improve patient experience, we would appreciate your feedback. Please fill out the short survey that you will receive by mail or MyChart. Your opinion is important to us! -Dr Hriday Stai  Second Trimester of Pregnancy  The second trimester of pregnancy is from week 13 through week 27. This is months 4 through 6 of pregnancy. The second trimester is often a time when you feel your best. Your body has adjusted to being pregnant, and you begin to feel better physically. During the second trimester:  Morning sickness has lessened or stopped completely.  You may have more energy.  You may have an increase in appetite. The second trimester is also a time when the unborn baby (fetus) is growing rapidly. At the end of the sixth month, the fetus may be up to 12 inches long and weigh about 1 pounds. You will likely begin to feel the baby move (quickening) between 16 and 20 weeks of pregnancy. Body changes during your second trimester Your body continues to go through many changes during your second trimester. The changes vary and generally return to normal after the baby is born. Physical changes  Your weight will continue to increase. You will notice your lower abdomen bulging out.  You may begin to get stretch marks on your hips, abdomen, and breasts.  Your breasts will continue to grow and to become tender.  Dark spots or blotches (chloasma or mask of pregnancy) may develop on your face.  A dark line from your belly button to the pubic area (linea nigra) may appear.  You may have changes in your hair. These can include thickening of your hair, rapid growth, and changes in texture. Some people also have hair loss during or after pregnancy, or hair that feels dry or thin. Health changes  You may develop headaches.  You may have heartburn.  You may develop constipation.  You may develop hemorrhoids or swollen, bulging  veins (varicose veins).  Your gums may bleed and may be sensitive to brushing and flossing.  You may urinate more often because the fetus is pressing on your bladder.  You may have back pain. This is caused by: ? Weight gain. ? Pregnancy hormones that are relaxing the joints in your pelvis. ? A shift in weight and the muscles that support your balance. Follow these instructions at home: Medicines  Follow your health care provider's instructions regarding medicine use. Specific medicines may be either safe or unsafe to take during pregnancy. Do not take any medicines unless approved by your health care provider.  Take a prenatal vitamin that contains at least 600 micrograms (mcg) of folic acid. Eating and drinking  Eat a healthy diet that includes fresh fruits and vegetables, whole grains, good sources of protein such as meat, eggs, or tofu, and low-fat dairy products.  Avoid raw meat and unpasteurized juice, milk, and cheese. These carry germs that can harm you and your baby.  You may need to take these actions to prevent or treat constipation: ? Drink enough fluid to keep your urine pale yellow. ? Eat foods that are high in fiber, such as beans, whole grains, and fresh fruits and vegetables. ? Limit foods that are high in fat and processed sugars, such as fried or sweet foods. Activity  Exercise only as directed by your health care provider. Most people can continue their usual exercise routine during pregnancy. Try to exercise for 30 minutes at least   5 days a week. Stop exercising if you develop contractions in your uterus.  Stop exercising if you develop pain or cramping in the lower abdomen or lower back.  Avoid exercising if it is very hot or humid or if you are at a high altitude.  Avoid heavy lifting.  If you choose to, you may have sex unless your health care provider tells you not to. Relieving pain and discomfort  Wear a supportive bra to prevent discomfort from  breast tenderness.  Take warm sitz baths to soothe any pain or discomfort caused by hemorrhoids. Use hemorrhoid cream if your health care provider approves.  Rest with your legs raised (elevated) if you have leg cramps or low back pain.  If you develop varicose veins: ? Wear support hose as told by your health care provider. ? Elevate your feet for 15 minutes, 3-4 times a day. ? Limit salt in your diet. Safety  Wear your seat belt at all times when driving or riding in a car.  Talk with your health care provider if someone is verbally or physically abusive to you. Lifestyle  Do not use hot tubs, steam rooms, or saunas.  Do not douche. Do not use tampons or scented sanitary pads.  Avoid cat litter boxes and soil used by cats. These carry germs that can cause birth defects in the baby and possibly loss of the fetus by miscarriage or stillbirth.  Do not use herbal remedies, alcohol, illegal drugs, or medicines that are not approved by your health care provider. Chemicals in these products can harm your baby.  Do not use any products that contain nicotine or tobacco, such as cigarettes, e-cigarettes, and chewing tobacco. If you need help quitting, ask your health care provider. General instructions  During a routine prenatal visit, your health care provider will do a physical exam and other tests. He or she will also discuss your overall health. Keep all follow-up visits. This is important.  Ask your health care provider for a referral to a local prenatal education class.  Ask for help if you have counseling or nutritional needs during pregnancy. Your health care provider can offer advice or refer you to specialists for help with various needs. Where to find more information  American Pregnancy Association: americanpregnancy.org  American College of Obstetricians and Gynecologists: acog.org/en/Womens%20Health/Pregnancy  Office on Women's Health: womenshealth.gov/pregnancy Contact  a health care provider if you have:  A headache that does not go away when you take medicine.  Vision changes or you see spots in front of your eyes.  Mild pelvic cramps, pelvic pressure, or nagging pain in the abdominal area.  Persistent nausea, vomiting, or diarrhea.  A bad-smelling vaginal discharge or foul-smelling urine.  Pain when you urinate.  Sudden or extreme swelling of your face, hands, ankles, feet, or legs.  A fever. Get help right away if you:  Have fluid leaking from your vagina.  Have spotting or bleeding from your vagina.  Have severe abdominal cramping or pain.  Have difficulty breathing.  Have chest pain.  Have fainting spells.  Have not felt your baby move for the time period told by your health care provider.  Have new or increased pain, swelling, or redness in an arm or leg. Summary  The second trimester of pregnancy is from week 13 through week 27 (months 4 through 6).  Do not use herbal remedies, alcohol, illegal drugs, or medicines that are not approved by your health care provider. Chemicals in these products can   harm your baby.  Exercise only as directed by your health care provider. Most people can continue their usual exercise routine during pregnancy.  Keep all follow-up visits. This is important. This information is not intended to replace advice given to you by your health care provider. Make sure you discuss any questions you have with your health care provider. Document Revised: 10/09/2019 Document Reviewed: 08/15/2019 Elsevier Patient Education  2021 Elsevier Inc.  

## 2020-07-23 ENCOUNTER — Encounter: Payer: Self-pay | Admitting: Obstetrics

## 2020-07-23 DIAGNOSIS — O0992 Supervision of high risk pregnancy, unspecified, second trimester: Secondary | ICD-10-CM

## 2020-07-23 DIAGNOSIS — R7309 Other abnormal glucose: Secondary | ICD-10-CM

## 2020-07-23 LAB — GLUCOSE TOLERANCE, 1 HOUR: Glucose, 1Hr PP: 170 mg/dL (ref 65–199)

## 2020-08-24 ENCOUNTER — Ambulatory Visit
Admission: RE | Admit: 2020-08-24 | Discharge: 2020-08-24 | Disposition: A | Discharge status: Home / Self Care | Payer: BC Managed Care – PPO | Source: Ambulatory Visit | Attending: Obstetrics & Gynecology | Admitting: Obstetrics & Gynecology

## 2020-08-24 ENCOUNTER — Other Ambulatory Visit: Payer: Self-pay

## 2020-08-24 DIAGNOSIS — Z3689 Encounter for other specified antenatal screening: Secondary | ICD-10-CM | POA: Diagnosis not present

## 2020-08-24 DIAGNOSIS — Z3A19 19 weeks gestation of pregnancy: Secondary | ICD-10-CM | POA: Diagnosis not present

## 2020-08-24 DIAGNOSIS — Z3492 Encounter for supervision of normal pregnancy, unspecified, second trimester: Secondary | ICD-10-CM | POA: Diagnosis not present

## 2020-08-25 ENCOUNTER — Ambulatory Visit (INDEPENDENT_AMBULATORY_CARE_PROVIDER_SITE_OTHER): Payer: BC Managed Care – PPO | Admitting: Obstetrics

## 2020-08-25 ENCOUNTER — Ambulatory Visit: Payer: BC Managed Care – PPO

## 2020-08-25 VITALS — BP 120/70 | Wt 207.0 lb

## 2020-08-25 DIAGNOSIS — Z3A19 19 weeks gestation of pregnancy: Secondary | ICD-10-CM

## 2020-08-25 DIAGNOSIS — O0992 Supervision of high risk pregnancy, unspecified, second trimester: Secondary | ICD-10-CM

## 2020-08-25 DIAGNOSIS — Z3689 Encounter for other specified antenatal screening: Secondary | ICD-10-CM

## 2020-08-25 LAB — POCT URINALYSIS DIPSTICK OB
Glucose, UA: NEGATIVE
POC,PROTEIN,UA: NEGATIVE

## 2020-08-25 NOTE — Progress Notes (Signed)
Routine Prenatal Care Visit  Subjective  Rebecca Gomez is a 30 y.o. G3P1011 at [redacted]w[redacted]d being seen today for ongoing prenatal care.  She is currently monitored for the following issues for this high-risk pregnancy and has History of cesarean delivery and High-risk pregnancy, first trimester on their problem list. She had an elevated 1hr GTT but has not done the three hr (work conflict) She bought a glucose monitor and is checking her sugars----------------------------------------------------------------------------------- Patient reports no complaints.    .  .   Pincus Large Fluid denies.  ----------------------------------------------------------------------------------- The following portions of the patient's history were reviewed and updated as appropriate: allergies, current medications, past family history, past medical history, past social history, past surgical history and problem list. Problem list updated.  Objective  Blood pressure 120/70, weight 207 lb (93.9 kg), last menstrual period 04/13/2020, unknown if currently breastfeeding. Pregravid weight 214 lb (97.1 kg) Total Weight Gain -7 lb (-3.175 kg) Urinalysis: Urine Protein Negative  Urine Glucose Negative  Fetal Status:           General:  Alert, oriented and cooperative. Patient is in no acute distress.  Skin: Skin is warm and dry. No rash noted.   Cardiovascular: Normal heart rate noted  Respiratory: Normal respiratory effort, no problems with respiration noted  Abdomen: Soft, gravid, appropriate for gestational age.       Pelvic:  Cervical exam deferred        Extremities: Normal range of motion.     Mental Status: Normal mood and affect. Normal behavior. Normal judgment and thought content.   Assessment   30 y.o. G3P1011 at [redacted]w[redacted]d by  01/18/2021, by Last Menstrual Period presenting for routine prenatal visit  Plan   pregnancy3  Problems (from 04/13/20 to present)    Problem Noted Resolved   High-risk pregnancy, first  trimester 06/09/2020 by Nadara Mustard, MD No   Overview Addendum 07/23/2020 11:41 AM by Mirna Mires, CNM     Nursing Staff Provider  Office Location  [facility] Dating   LMP = 8w [place]  Language  English Anatomy US    Flu Vaccine  02/2020 Genetic Screen  NIPS:MaternT- neg- female  TDaP vaccine    Hgb A1C or  GTT Early :170. 3hr test ordered 07/23/20 Third trimester :   Rhogam  n/a   LAB RESULTS   Feeding Plan  Blood Type O/Positive/-- (02/09 1130)   Contraception  Antibody Negative (02/09 1130)  Circumcision  Rubella Immune  Pediatrician   RPR neg  Support Person  HBsAg neg  Prenatal Classes  HIV neg    Varicella Immune  BTL Consent  GBS  (For PCN allergy, check sensitivities)        VBAC Consent  Desires TOLAC Pap  08/22/19 nml, repeat pp    Hgb Electro  n/a    CF Neg     SMA Neg              Previous Version       Preterm labor symptoms and general obstetric precautions including but not limited to vaginal bleeding, contractions, leaking of fluid and fetal movement were reviewed in detail with the patient. Please refer to After Visit Summary for other counseling recommendations.  Had her anatomy scan yesterday. And nees repeat for her outflow tracts visualized. She would like to document her glucose levels instead of doing the 3hr GTT. Will bring records next visit with MD  Return in about 4 weeks (around 09/22/2020) for return OB, rescan to  check outflow tracts.  Mirna Mires, CNM  08/25/2020 12:04 PM

## 2020-08-25 NOTE — Progress Notes (Signed)
ROB- anatomy f/u, no concerns

## 2020-09-11 ENCOUNTER — Other Ambulatory Visit: Payer: Self-pay

## 2020-09-11 ENCOUNTER — Other Ambulatory Visit: Payer: Self-pay | Admitting: Obstetrics & Gynecology

## 2020-09-11 ENCOUNTER — Ambulatory Visit (INDEPENDENT_AMBULATORY_CARE_PROVIDER_SITE_OTHER): Payer: BC Managed Care – PPO | Admitting: Obstetrics & Gynecology

## 2020-09-11 ENCOUNTER — Encounter: Payer: Self-pay | Admitting: Obstetrics & Gynecology

## 2020-09-11 VITALS — BP 120/80 | Wt 205.0 lb

## 2020-09-11 DIAGNOSIS — Z3689 Encounter for other specified antenatal screening: Secondary | ICD-10-CM

## 2020-09-11 DIAGNOSIS — O0992 Supervision of high risk pregnancy, unspecified, second trimester: Secondary | ICD-10-CM

## 2020-09-11 DIAGNOSIS — Z3A21 21 weeks gestation of pregnancy: Secondary | ICD-10-CM

## 2020-09-11 DIAGNOSIS — O99210 Obesity complicating pregnancy, unspecified trimester: Secondary | ICD-10-CM

## 2020-09-11 DIAGNOSIS — O0991 Supervision of high risk pregnancy, unspecified, first trimester: Secondary | ICD-10-CM

## 2020-09-11 LAB — POCT URINALYSIS DIPSTICK OB
Glucose, UA: NEGATIVE
POC,PROTEIN,UA: NEGATIVE

## 2020-09-11 NOTE — Patient Instructions (Signed)
Thank you for choosing [facility]. As part of our ongoing efforts to improve patient experience, we would appreciate your feedback. Please fill out the short survey that you will receive by mail or MyChart. Your opinion is important to us! -Dr Nagee Goates  

## 2020-09-11 NOTE — Progress Notes (Signed)
  Subjective  Fetal Movement? yes Contractions? no Leaking Fluid? no Vaginal Bleeding? no  Objective  BP 120/80   Wt 205 lb (93 kg)   LMP 04/13/2020   BMI 36.31 kg/m  General: NAD Pumonary: no increased work of breathing Abdomen: gravid, non-tender Extremities: no edema Psychiatric: mood appropriate, affect full  Assessment  30 y.o. G3P1011 at [redacted]w[redacted]d by  01/18/2021, by Last Menstrual Period presenting for routine prenatal visit  Plan   Problem List Items Addressed This Visit      Other   High-risk pregnancy, first trimester    Other Visit Diagnoses    [redacted] weeks gestation of pregnancy    -  Primary   Supervision of high risk pregnancy in second trimester       Obesity in pregnancy, antepartum          pregnancy3  Problems (from 04/13/20 to present)    Problem Noted Resolved   High-risk pregnancy, first trimester 06/09/2020 by Nadara Mustard, MD No   Overview Addendum 09/11/2020  9:59 AM by Nadara Mustard, MD     Nursing Staff Provider  Office Location  [facility] Dating   LMP = 8w [place]  Language  English Anatomy US  [facility] nml  Flu Vaccine  02/2020 Genetic Screen  NIPS:MaternT- neg- female  TDaP vaccine    Hgb A1C or  GTT Early :170.f/u test nml Third trimester :   Rhogam  n/a   LAB RESULTS   Feeding Plan  Blood Type O/Positive/-- (02/09 1130)   Contraception  Antibody Negative (02/09 1130)  Circumcision  Rubella Immune  Pediatrician   RPR neg  Support Person  HBsAg neg  Prenatal Classes  HIV neg    Varicella Immune  BTL Consent  GBS  (For PCN allergy, check sensitivities)        VBAC Consent  Desires TOLAC Pap  08/22/19 nml, repeat pp    Hgb Electro  n/a    CF Neg     SMA Neg              Previous Version     PNV  BS Log reviewed, all normal\  Repeat this week long check of blood sugar at 28 weeks to rule out GDM again then  [facility]  Annamarie Major, MD, [facility], [facility] 09/11/2020  10:04 AM

## 2020-09-11 NOTE — Addendum Note (Signed)
Addended by: Cornelius Moras D on: 09/11/2020 10:08 AM   Modules accepted: Orders

## 2020-10-09 ENCOUNTER — Other Ambulatory Visit: Payer: Self-pay

## 2020-10-09 ENCOUNTER — Ambulatory Visit (INDEPENDENT_AMBULATORY_CARE_PROVIDER_SITE_OTHER): Payer: BC Managed Care – PPO | Admitting: Obstetrics

## 2020-10-09 VITALS — BP 120/70 | Ht 63.0 in | Wt 206.8 lb

## 2020-10-09 DIAGNOSIS — Z3A25 25 weeks gestation of pregnancy: Secondary | ICD-10-CM

## 2020-10-09 DIAGNOSIS — O0992 Supervision of high risk pregnancy, unspecified, second trimester: Secondary | ICD-10-CM

## 2020-10-09 LAB — POCT URINALYSIS DIPSTICK OB
Glucose, UA: NEGATIVE
POC,PROTEIN,UA: NEGATIVE

## 2020-10-09 NOTE — Addendum Note (Signed)
Addended by: Clement Husbands A on: 10/09/2020 03:37 PM   Modules accepted: Orders

## 2020-10-09 NOTE — Progress Notes (Signed)
  Routine Prenatal Care Visit  Subjective  Rebecca Gomez is a 30 y.o. G3P1011 at [redacted]w[redacted]d being seen today for ongoing prenatal care.  She is currently monitored for the following issues for this high-risk pregnancy and has History of cesarean delivery and High-risk pregnancy, first trimester on their problem list.  ----------------------------------------------------------------------------------- Patient reports no complaints.  She has stopped checking her glucose levels as advised by Dr. Tiburcio Pea. She has named the baby "Rebecca Gomez" Contractions: Not present. Vag. Bleeding: None.  Movement: Present. Leaking Fluid denies.  ----------------------------------------------------------------------------------- The following portions of the patient's history were reviewed and updated as appropriate: allergies, current medications, past family history, past medical history, past social history, past surgical history and problem list. Problem list updated.  Objective  Blood pressure 120/70, height 5\' 3"  (1.6 m), weight 206 lb 12.8 oz (93.8 kg), last menstrual period 04/13/2020, unknown if currently breastfeeding. Pregravid weight 214 lb (97.1 kg) Total Weight Gain -7 lb 3.2 oz (-3.266 kg) Urinalysis: Urine Protein    Urine Glucose    Fetal Status:     Movement: Present     General:  Alert, oriented and cooperative. Patient is in no acute distress.  Skin: Skin is warm and dry. No rash noted.   Cardiovascular: Normal heart rate noted  Respiratory: Normal respiratory effort, no problems with respiration noted  Abdomen: Soft, gravid, appropriate for gestational age. Pain/Pressure: Absent     Pelvic:  Cervical exam deferred        Extremities: Normal range of motion.     Mental Status: Normal mood and affect. Normal behavior. Normal judgment and thought content.   Assessment   30 y.o. G3P1011 at [redacted]w[redacted]d by  01/18/2021, by Last Menstrual Period presenting for routine prenatal visit  Plan   pregnancy3   Problems (from 04/13/20 to present)    Problem Noted Resolved   High-risk pregnancy, first trimester 06/09/2020 by 06/11/2020, MD No   Overview Addendum 09/11/2020  9:59 AM by 09/13/2020, MD     Nursing Staff Provider  Office Location  [facility] Dating   LMP = 8w Nadara Mustard  Language  English Anatomy US  [facility] nml  Flu Vaccine  02/2020 Genetic Screen  NIPS:MaternT- neg- female  TDaP vaccine    Hgb A1C or  GTT Early :170.f/u test nml Third trimester :   Rhogam  n/a   LAB RESULTS   Feeding Plan  Blood Type O/Positive/-- (02/09 1130)   Contraception  Antibody Negative (02/09 1130)  Circumcision  Rubella Immune  Pediatrician   RPR neg  Support Person  HBsAg neg  Prenatal Classes  HIV neg    Varicella Immune  BTL Consent  GBS  (For PCN allergy, check sensitivities)        VBAC Consent  Desires TOLAC Pap  08/22/19 nml, repeat pp    Hgb Electro  n/a    CF Neg     SMA Neg              Previous Version       Preterm labor symptoms and general obstetric precautions including but not limited to vaginal bleeding, contractions, leaking of fluid and fetal movement were reviewed in detail with the patient. Please refer to After Visit Summary for other counseling recommendations.   No follow-ups on file.  10/22/19, CNM  10/09/2020 10:37 AM

## 2020-10-29 ENCOUNTER — Other Ambulatory Visit: Payer: Self-pay

## 2020-10-29 ENCOUNTER — Ambulatory Visit
Admission: RE | Admit: 2020-10-29 | Discharge: 2020-10-29 | Disposition: A | Discharge status: Home / Self Care | Payer: BC Managed Care – PPO | Source: Ambulatory Visit | Attending: Obstetrics & Gynecology | Admitting: Obstetrics & Gynecology

## 2020-10-29 DIAGNOSIS — Z3A28 28 weeks gestation of pregnancy: Secondary | ICD-10-CM | POA: Diagnosis not present

## 2020-10-29 DIAGNOSIS — Z3689 Encounter for other specified antenatal screening: Secondary | ICD-10-CM | POA: Diagnosis not present

## 2020-10-29 DIAGNOSIS — O321XX0 Maternal care for breech presentation, not applicable or unspecified: Secondary | ICD-10-CM | POA: Diagnosis not present

## 2020-11-03 ENCOUNTER — Ambulatory Visit (INDEPENDENT_AMBULATORY_CARE_PROVIDER_SITE_OTHER): Payer: BC Managed Care – PPO | Admitting: Obstetrics

## 2020-11-03 ENCOUNTER — Other Ambulatory Visit: Payer: Self-pay

## 2020-11-03 VITALS — BP 120/74 | Wt 205.0 lb

## 2020-11-03 DIAGNOSIS — Z3A29 29 weeks gestation of pregnancy: Secondary | ICD-10-CM

## 2020-11-03 DIAGNOSIS — O0992 Supervision of high risk pregnancy, unspecified, second trimester: Secondary | ICD-10-CM

## 2020-11-03 LAB — POCT URINALYSIS DIPSTICK OB
Glucose, UA: NEGATIVE
POC,PROTEIN,UA: NEGATIVE

## 2020-11-03 NOTE — Progress Notes (Signed)
Has 1 wk Blood sugar log today. No complaints

## 2020-11-03 NOTE — Progress Notes (Signed)
Routine Prenatal Care Visit  Subjective  Rebecca Gomez is a 30 y.o. G3P1011 at [redacted]w[redacted]d being seen today for ongoing prenatal care.  She is currently monitored for the following issues for this high-risk pregnancy and has History of cesarean delivery and High-risk pregnancy, first trimester on their problem list.  ----------------------------------------------------------------------------------- Patient reports that she considers herself a GDM. Has noticed an increase in her FBS readings, and her elevated postmeal values, although largely WNL, definitely become elevated if she eats any carbs. She would like to continue her glucose checks daily..  Per a discussion with Dr. Tiburcio Pea, sheis checking her sugars for a week rather than doing a one hour GTT today. Please see the scanned glucose log Contractions: Irritability. Vag. Bleeding: None.  Movement: Present. Leaking Fluid denies.  ----------------------------------------------------------------------------------- The following portions of the patient's history were reviewed and updated as appropriate: allergies, current medications, past family history, past medical history, past social history, past surgical history and problem list. Problem list updated.  Objective  Blood pressure 120/74, weight 205 lb (93 kg), last menstrual period 04/13/2020, unknown if currently breastfeeding. Pregravid weight 214 lb (97.1 kg) Total Weight Gain -9 lb (-4.082 kg) Urinalysis: Urine Protein Negative  Urine Glucose Negative  Fetal Status:     Movement: Present     General:  Alert, oriented and cooperative. Patient is in no acute distress.  Skin: Skin is warm and dry. No rash noted.   Cardiovascular: Normal heart rate noted  Respiratory: Normal respiratory effort, no problems with respiration noted  Abdomen: Soft, gravid, appropriate for gestational age. Pain/Pressure: Absent     Pelvic:  Cervical exam deferred        Extremities: Normal range of motion.      Mental Status: Normal mood and affect. Normal behavior. Normal judgment and thought content.   Assessment   30 y.o. G3P1011 at [redacted]w[redacted]d by  01/18/2021, by Last Menstrual Period presenting for routine prenatal visit  Plan   pregnancy3  Problems (from 04/13/20 to present)    Problem Noted Resolved   High-risk pregnancy, first trimester 06/09/2020 by Nadara Mustard, MD No   Overview Addendum 09/11/2020  9:59 AM by Nadara Mustard, MD     Nursing Staff Provider  Office Location  [facility] Dating   LMP = 8w [place]  Language  English Anatomy US  [facility] nml  Flu Vaccine  02/2020 Genetic Screen  NIPS:MaternT- neg- female  TDaP vaccine    Hgb A1C or  GTT Early :170.f/u test nml Third trimester :   Rhogam  n/a   LAB RESULTS   Feeding Plan  Blood Type O/Positive/-- (02/09 1130)   Contraception  Antibody Negative (02/09 1130)  Circumcision  Rubella Immune  Pediatrician   RPR neg  Support Person  HBsAg neg  Prenatal Classes  HIV neg    Varicella Immune  BTL Consent  GBS  (For PCN allergy, check sensitivities)        VBAC Consent  Desires TOLAC Pap  08/22/19 nml, repeat pp    Hgb Electro  n/a    CF Neg     SMA Neg                   Preterm labor symptoms and general obstetric precautions including but not limited to vaginal bleeding, contractions, leaking of fluid and fetal movement were reviewed in detail with the patient. Please refer to After Visit Summary for other counseling recommendations.  Her insurance paysfor a dietician visit. She already has  a meter and is checking her glucose values. Will bring them to next visit and see an MD  No follow-ups on file.  Mirna Mires, CNM  11/03/2020 2:08 PM

## 2020-11-11 ENCOUNTER — Other Ambulatory Visit: Payer: Self-pay

## 2020-11-11 ENCOUNTER — Ambulatory Visit (INDEPENDENT_AMBULATORY_CARE_PROVIDER_SITE_OTHER): Payer: BC Managed Care – PPO | Admitting: Obstetrics and Gynecology

## 2020-11-11 ENCOUNTER — Encounter: Payer: Self-pay | Admitting: Obstetrics and Gynecology

## 2020-11-11 VITALS — BP 124/70 | Ht 63.0 in | Wt 205.4 lb

## 2020-11-11 DIAGNOSIS — O0991 Supervision of high risk pregnancy, unspecified, first trimester: Secondary | ICD-10-CM

## 2020-11-11 DIAGNOSIS — Z23 Encounter for immunization: Secondary | ICD-10-CM | POA: Diagnosis not present

## 2020-11-11 DIAGNOSIS — Z3A30 30 weeks gestation of pregnancy: Secondary | ICD-10-CM

## 2020-11-11 DIAGNOSIS — O0993 Supervision of high risk pregnancy, unspecified, third trimester: Secondary | ICD-10-CM

## 2020-11-11 NOTE — Patient Instructions (Signed)
30 y.o. [id number] at [redacted]w[redacted]d with Estimated Date of Delivery: 01/18/21 was seen today in office to discuss trial of labor after cesarean section (TOLAC) versus elective repeat cesarean delivery (ERCD). The following risks were discussed with the patient.  Risk of uterine rupture at term is 0.78 percent with TOLAC and 0.22 percent with ERCD. 1 in 10 uterine ruptures will result in neonatal death or neurological injury. The benefits of a trial of labor after cesarean (TOLAC) resulting in a vaginal birth after cesarean (VBAC) include the following: shorter length of hospital stay and postpartum recovery (in most cases); fewer complications, such as postpartum fever, wound or uterine infection, thromboembolism (blood clots in the leg or lung), need for blood transfusion and fewer neonatal breathing problems. The risks of an attempted VBAC or TOLAC include the following: Risk of failed trial of labor after cesarean (TOLAC) without a vaginal birth after cesarean (VBAC) resulting in repeat cesarean delivery (RCD) in about 20 to 40 percent of women who attempt VBAC.  Her individualized success rate using the MFMU VBAC risk calculator is 37.5%. Predicted chance of vaginal birth after cesarean: 37.5% 95% confidence interval: 34.7%, 40.4%  Risk of rupture of uterus resulting in an emergency cesarean delivery. The risk of uterine rupture may be related in part to the type of uterine incision made during the first cesarean delivery. A previous transverse uterine incision has the lowest risk of rupture (0.2 to 1.5 percent risk). Vertical or T-shaped uterine incisions have a higher risk of uterine rupture (4 to 9 percent risk)The risk of fetal death is very low with both VBAC and elective repeat cesarean delivery (ERCD), but the likelihood of fetal death is higher with VBAC than with ERCD. Maternal death is very rare with either type of delivery. The risks of an elective repeat cesarean delivery (ERCD) were reviewed with the  patient including but not limited to: 06/998 risk of uterine rupture which could have serious consequences, bleeding which may require transfusion; infection which may require antibiotics; injury to bowel, bladder or other surrounding organs (bowel, bladder, ureters); injury to the fetus; need for additional procedures including hysterectomy in the event of a life-threatening hemorrhage; thromboembolic phenomenon; abnormal placentation; incisional problems; death and other postoperative or anesthesia complications.    In addition we discussed that our collective office practice is to allow patient's who desire to attempt TOLAC to go into labor naturally.  There is some limited data that rupture rate may increase past [redacted] weeks gestation, but it is reasonable for women who are strongly committed to [facility] to continue pregnancy into the 41st week.  Medical indications necessetating early delivery may arise during the course of any pregnancy.  Given the contraindication on the use of prostaglandins for use in cervical ripening,  recommendation would be to proceed with repeat cesarean for delivery for patient's with unfavorable cervix (low Bishops score) who reach 41 weeks or who otherwise have a medical indication for early delivery.   These risks and benefits are summarized on the consent form, which was reviewed with the patient during the visit.  All her questions answered and she signed a consent indicating a preference for TOLAC/ERCD. A copy of the consent was given to the patient.

## 2020-11-11 NOTE — Progress Notes (Signed)
Routine Prenatal Care Visit  Subjective  Rebecca Gomez is a 30 y.o. G3P1011 at [redacted]w[redacted]d being seen today for ongoing prenatal care.  She is currently monitored for the following issues for this high-risk pregnancy and has History of cesarean delivery and High-risk pregnancy, first trimester on their problem list.  ----------------------------------------------------------------------------------- Patient reports no complaints.   Contractions: Not present. Vag. Bleeding: None.  Movement: Present. Denies leaking of fluid.  ----------------------------------------------------------------------------------- The following portions of the patient's history were reviewed and updated as appropriate: allergies, current medications, past family history, past medical history, past social history, past surgical history and problem list. Problem list updated.   Objective  Blood pressure 124/70, height 5\' 3"  (1.6 m), weight 205 lb 6.4 oz (93.2 kg), last menstrual period 04/13/2020, unknown if currently breastfeeding. Pregravid weight 214 lb (97.1 kg) Total Weight Gain -8 lb 9.6 oz (-3.901 kg) Urinalysis:      Fetal Status: Fetal Heart Rate (bpm): 135 Fundal Height: 30 cm Movement: Present     General:  Alert, oriented and cooperative. Patient is in no acute distress.  Skin: Skin is warm and dry. No rash noted.   Cardiovascular: Normal heart rate noted  Respiratory: Normal respiratory effort, no problems with respiration noted  Abdomen: Soft, gravid, appropriate for gestational age. Pain/Pressure: Absent     Pelvic:  Cervical exam deferred        Extremities: Normal range of motion.  Edema: None  Mental Status: Normal mood and affect. Normal behavior. Normal judgment and thought content.      Assessment   30 y.o. G3P1011 at [redacted]w[redacted]d by  01/18/2021, by Last Menstrual Period presenting for routine prenatal visit  Plan   [email or web address]  Problems (from 04/13/20 to present)     Problem Noted Resolved    High-risk pregnancy, first trimester 06/09/2020 by 06/11/2020, MD No   Overview Addendum 09/11/2020  9:59 AM by 09/13/2020, MD     Nursing Staff Provider  Office Location  [facility] Dating   LMP = 8w Nadara Mustard  Language  English Anatomy US  [facility] nml  Flu Vaccine  02/2020 Genetic Screen  NIPS:MaternT- neg- female  TDaP vaccine    Hgb A1C or  GTT Early :170.f/u test nml Third trimester :   Rhogam  n/a   LAB RESULTS   Feeding Plan  Blood Type O/Positive/-- (02/09 1130)   Contraception  Antibody Negative (02/09 1130)  Circumcision  Rubella Immune  Pediatrician   RPR neg  Support Person  HBsAg neg  Prenatal Classes  HIV neg    Varicella Immune  BTL Consent  GBS  (For PCN allergy, check sensitivities)        VBAC Consent  Desires TOLAC Pap  08/22/19 nml, repeat pp    Hgb Electro  n/a    CF Neg     SMA Neg                   30 y.o. 10/22/19 at [redacted]w[redacted]d with Estimated Date of Delivery: 01/18/21 was seen today in office to discuss trial of labor after cesarean section (TOLAC) versus elective repeat cesarean delivery (ERCD). The following risks were discussed with the patient.  Risk of uterine rupture at term is 0.78 percent with TOLAC and 0.22 percent with ERCD. 1 in 10 uterine ruptures will result in neonatal death or neurological injury. The benefits of a trial of labor after cesarean (TOLAC) resulting in a vaginal birth after cesarean (VBAC) include the following: shorter  length of hospital stay and postpartum recovery (in most cases); fewer complications, such as postpartum fever, wound or uterine infection, thromboembolism (blood clots in the leg or lung), need for blood transfusion and fewer neonatal breathing problems. The risks of an attempted VBAC or TOLAC include the following: Risk of failed trial of labor after cesarean (TOLAC) without a vaginal birth after cesarean (VBAC) resulting in repeat cesarean delivery (RCD) in about 20 to 40 percent of women who attempt VBAC.  Her  individualized success rate using the MFMU VBAC risk calculator is 37.5%.   Predicted chance of vaginal birth after cesarean: 37.5% 95% confidence interval: 34.7%, 40.4% Risk of rupture of uterus resulting in an emergency cesarean delivery. The risk of uterine rupture may be related in part to the type of uterine incision made during the first cesarean delivery. A previous transverse uterine incision has the lowest risk of rupture (0.2 to 1.5 percent risk). Vertical or T-shaped uterine incisions have a higher risk of uterine rupture (4 to 9 percent risk)The risk of fetal death is very low with both VBAC and elective repeat cesarean delivery (ERCD), but the likelihood of fetal death is higher with VBAC than with ERCD. Maternal death is very rare with either type of delivery. The risks of an elective repeat cesarean delivery (ERCD) were reviewed with the patient including but not limited to: 06/998 risk of uterine rupture which could have serious consequences, bleeding which may require transfusion; infection which may require antibiotics; injury to bowel, bladder or other surrounding organs (bowel, bladder, ureters); injury to the fetus; need for additional procedures including hysterectomy in the event of a life-threatening hemorrhage; thromboembolic phenomenon; abnormal placentation; incisional problems; death and other postoperative or anesthesia complications.    In addition we discussed that our collective office practice is to allow patient's who desire to attempt TOLAC to go into labor naturally.  There is some limited data that rupture rate may increase past [redacted] weeks gestation, but it is reasonable for women who are strongly committed to [facility] to continue pregnancy into the 41st week.  Medical indications necessetating early delivery may arise during the course of any pregnancy.  Given the contraindication on the use of prostaglandins for use in cervical ripening,  recommendation would be to proceed  with repeat cesarean for delivery for patient's with unfavorable cervix (low Bishops score) who reach 41 weeks or who otherwise have a medical indication for early delivery.   These risks and benefits are summarized on the consent form, which was reviewed with the patient during the visit.  All her questions answered and she signed a consent indicating a preference for TOLAC/ERCD. A copy of the consent was given to the patient.  TDAP today She will continue to check glucose levels, provided a log. She has had 3 elevated values this last week.   Gestational age appropriate obstetric precautions including but not limited to vaginal bleeding, contractions, leaking of fluid and fetal movement were reviewed in detail with the patient.    Return in about 2 weeks (around 11/25/2020) for ROB in person.  Natale Milch MD [facility], [facility] 11/11/2020, 2:46 PM

## 2020-11-12 DIAGNOSIS — H5203 Hypermetropia, bilateral: Secondary | ICD-10-CM | POA: Diagnosis not present

## 2020-11-26 ENCOUNTER — Ambulatory Visit (INDEPENDENT_AMBULATORY_CARE_PROVIDER_SITE_OTHER): Payer: BC Managed Care – PPO | Admitting: Advanced Practice Midwife

## 2020-11-26 ENCOUNTER — Other Ambulatory Visit: Payer: Self-pay

## 2020-11-26 ENCOUNTER — Encounter: Payer: Self-pay | Admitting: Advanced Practice Midwife

## 2020-11-26 VITALS — BP 120/72 | Wt 203.0 lb

## 2020-11-26 DIAGNOSIS — Z3A32 32 weeks gestation of pregnancy: Secondary | ICD-10-CM

## 2020-11-26 DIAGNOSIS — O99213 Obesity complicating pregnancy, third trimester: Secondary | ICD-10-CM

## 2020-11-26 DIAGNOSIS — O0993 Supervision of high risk pregnancy, unspecified, third trimester: Secondary | ICD-10-CM

## 2020-11-26 DIAGNOSIS — O99210 Obesity complicating pregnancy, unspecified trimester: Secondary | ICD-10-CM | POA: Insufficient documentation

## 2020-11-26 LAB — POCT URINALYSIS DIPSTICK OB
Glucose, UA: NEGATIVE
POC,PROTEIN,UA: NEGATIVE

## 2020-11-26 NOTE — Progress Notes (Signed)
ROB - no concerns. RM 5 

## 2020-11-26 NOTE — Progress Notes (Signed)
Routine Prenatal Care Visit  Subjective  Rebecca Gomez is a 30 y.o. G3P1011 at [redacted]w[redacted]d being seen today for ongoing prenatal care.  She is currently monitored for the following issues for this high-risk pregnancy and has History of cesarean delivery; Supervision of high-risk pregnancy, third trimester; and Obesity affecting pregnancy on their problem list.  ----------------------------------------------------------------------------------- Patient reports some abdominal/pelvic discomfort in the past few days she thinks due to baby moving from transverse to head down.   Contractions: Not present. Vag. Bleeding: None.  Movement: Present. Leaking Fluid denies.  ----------------------------------------------------------------------------------- The following portions of the patient's history were reviewed and updated as appropriate: allergies, current medications, past family history, past medical history, past social history, past surgical history and problem list. Problem list updated.  Objective  Blood pressure 120/72, weight 203 lb (92.1 kg), last menstrual period 04/13/2020 Pregravid weight 214 lb (97.1 kg) Total Weight Gain -11 lb (-4.99 kg) Urinalysis: Urine Protein Negative  Urine Glucose Negative  Fetal Status: Fetal Heart Rate (bpm): 137 Fundal Height: 33 cm Movement: Present      BS log: did not bring to visit; she reports all normal fasting and only 1 or 2 elevated after meals since last visit. She has adjusted diet and physical activity level.  General:  Alert, oriented and cooperative. Patient is in no acute distress.  Skin: Skin is warm and dry. No rash noted.   Cardiovascular: Normal heart rate noted  Respiratory: Normal respiratory effort, no problems with respiration noted  Abdomen: Soft, gravid, appropriate for gestational age. Pain/Pressure: Present     Pelvic:  Cervical exam deferred        Extremities: Normal range of motion.  Edema: None  Mental Status: Normal mood and  affect. Normal behavior. Normal judgment and thought content.   Assessment   30 y.o. G3P1011 at [redacted]w[redacted]d by  01/18/2021, by Last Menstrual Period presenting for routine prenatal visit  Plan   pregnancy3  Problems (from 04/13/20 to present)    Problem Noted Resolved   Obesity affecting pregnancy 11/26/2020 by Tresea Mall, CNM No   Supervision of high-risk pregnancy, third trimester 06/09/2020 by Nadara Mustard, MD No   Overview Addendum 11/11/2020  2:51 PM by Natale Milch, MD     Nursing Staff Provider  Office Location  [facility] Dating   LMP = 8w [place]  Language  English Anatomy US  [facility] nml  Flu Vaccine  02/2020 Genetic Screen  NIPS:MaternT- neg- female  TDaP vaccine   11/11/2020 Hgb A1C or  GTT Early :170.f/u test nml Third trimester : declined, is monitoring   Rhogam  n/a   LAB RESULTS   Feeding Plan  Blood Type O/Positive/-- (02/09 1130)   Contraception  Antibody Negative (02/09 1130)  Circumcision  Rubella Immune  Pediatrician   RPR neg  Support Person  HBsAg neg  Prenatal Classes  HIV neg    Varicella Immune  BTL Consent  GBS  (For PCN allergy, check sensitivities)        VBAC Consent  Desires TOLAC 11/11/2020 Pap  08/22/19 nml, repeat pp    Hgb Electro  n/a    CF Neg     SMA Neg               GDM: unclear of diagnosis  Preterm labor symptoms and general obstetric precautions including but not limited to vaginal bleeding, contractions, leaking of fluid and fetal movement were reviewed in detail with the patient. Please refer to After Visit Summary for other  counseling recommendations.   Return in about 2 weeks (around 12/10/2020) for rob.  Tresea Mall, CNM 11/26/2020 10:13 AM

## 2020-12-11 ENCOUNTER — Other Ambulatory Visit: Payer: Self-pay

## 2020-12-11 ENCOUNTER — Ambulatory Visit (INDEPENDENT_AMBULATORY_CARE_PROVIDER_SITE_OTHER): Payer: BC Managed Care – PPO | Admitting: Obstetrics

## 2020-12-11 VITALS — BP 118/70 | Ht 63.0 in | Wt 205.4 lb

## 2020-12-11 DIAGNOSIS — Z3A34 34 weeks gestation of pregnancy: Secondary | ICD-10-CM

## 2020-12-11 DIAGNOSIS — O0993 Supervision of high risk pregnancy, unspecified, third trimester: Secondary | ICD-10-CM

## 2020-12-11 NOTE — Progress Notes (Signed)
Routine Prenatal Care Visit  Subjective  Rebecca Gomez is a 30 y.o. G3P1011 at [redacted]w[redacted]d being seen today for ongoing prenatal care.  She is currently monitored for the following issues for this high-risk pregnancy and has History of cesarean delivery; Supervision of high-risk pregnancy, third trimester; and Obesity affecting pregnancy on their problem list.  ----------------------------------------------------------------------------------- Patient reports no complaints.  Her glucose readings are excellent. Contractions: Irregular. Vag. Bleeding: None.  Movement: Present. Leaking Fluid denies.  ----------------------------------------------------------------------------------- The following portions of the patient's history were reviewed and updated as appropriate: allergies, current medications, past family history, past medical history, past social history, past surgical history and problem list. Problem list updated.  Objective  Blood pressure 118/70, height 5\' 3"  (1.6 m), weight 205 lb 6.4 oz (93.2 kg), last menstrual period 04/13/2020, unknown if currently breastfeeding. Pregravid weight 214 lb (97.1 kg) Total Weight Gain -8 lb 9.6 oz (-3.901 kg) Urinalysis: Urine Protein    Urine Glucose    Fetal Status:     Movement: Present     General:  Alert, oriented and cooperative. Patient is in no acute distress.  Skin: Skin is warm and dry. No rash noted.   Cardiovascular: Normal heart rate noted  Respiratory: Normal respiratory effort, no problems with respiration noted  Abdomen: Soft, gravid, appropriate for gestational age. Pain/Pressure: Absent     Pelvic:  Cervical exam deferred        Extremities: Normal range of motion.     Mental Status: Normal mood and affect. Normal behavior. Normal judgment and thought content.   Assessment   30 y.o. 04/15/2020 at [redacted]w[redacted]d by  01/18/2021, by Last Menstrual Period presenting for routine prenatal visit  Plan   pregnancy3  Problems (from 04/13/20 to  present)    Problem Noted Resolved   Obesity affecting pregnancy 11/26/2020 by 11/28/2020, CNM No   Supervision of high-risk pregnancy, third trimester 06/09/2020 by 06/11/2020, MD No   Overview Addendum 11/11/2020  2:51 PM by 11/13/2020, MD     Nursing Staff Provider  Office Location  [facility] Dating   LMP = 8w Natale Milch  Language  English Anatomy US  [facility] nml  Flu Vaccine  02/2020 Genetic Screen  NIPS:MaternT- neg- female  TDaP vaccine   11/11/2020 Hgb A1C or  GTT Early :170.f/u test nml Third trimester : declined, is monitoring   Rhogam  n/a   LAB RESULTS   Feeding Plan  Blood Type O/Positive/-- (02/09 1130)   Contraception  Antibody Negative (02/09 1130)  Circumcision  Rubella Immune  Pediatrician   RPR neg  Support Person  HBsAg neg  Prenatal Classes  HIV neg    Varicella Immune  BTL Consent  GBS  (For PCN allergy, check sensitivities)        VBAC Consent  Desires TOLAC 11/11/2020 Pap  08/22/19 nml, repeat pp    Hgb Electro  n/a    CF Neg     SMA Neg                  Preterm labor symptoms and general obstetric precautions including but not limited to vaginal bleeding, contractions, leaking of fluid and fetal movement were reviewed in detail with the patient. Please refer to After Visit Summary for other counseling recommendations.  Praised for having souch tight glucose control. She will continue checking hre glucose levels until MD decides otherwise.  Return in about 2 weeks (around 12/25/2020).  02/24/2021, CNM  12/11/2020 4:45 PM

## 2020-12-15 ENCOUNTER — Other Ambulatory Visit: Payer: Self-pay | Admitting: Obstetrics & Gynecology

## 2020-12-23 ENCOUNTER — Other Ambulatory Visit: Payer: Self-pay

## 2020-12-23 ENCOUNTER — Encounter: Payer: Self-pay | Admitting: Obstetrics and Gynecology

## 2020-12-23 ENCOUNTER — Ambulatory Visit (INDEPENDENT_AMBULATORY_CARE_PROVIDER_SITE_OTHER): Payer: BC Managed Care – PPO | Admitting: Obstetrics and Gynecology

## 2020-12-23 ENCOUNTER — Other Ambulatory Visit (HOSPITAL_COMMUNITY)
Admission: RE | Admit: 2020-12-23 | Discharge: 2020-12-23 | Disposition: A | Discharge status: Home / Self Care | Payer: BC Managed Care – PPO | Source: Ambulatory Visit | Attending: Obstetrics and Gynecology | Admitting: Obstetrics and Gynecology

## 2020-12-23 VITALS — BP 120/70 | Wt 203.0 lb

## 2020-12-23 DIAGNOSIS — O99213 Obesity complicating pregnancy, third trimester: Secondary | ICD-10-CM

## 2020-12-23 DIAGNOSIS — Z98891 History of uterine scar from previous surgery: Secondary | ICD-10-CM

## 2020-12-23 DIAGNOSIS — O0993 Supervision of high risk pregnancy, unspecified, third trimester: Secondary | ICD-10-CM | POA: Diagnosis not present

## 2020-12-23 DIAGNOSIS — O24410 Gestational diabetes mellitus in pregnancy, diet controlled: Secondary | ICD-10-CM

## 2020-12-23 DIAGNOSIS — Z3A36 36 weeks gestation of pregnancy: Secondary | ICD-10-CM

## 2020-12-23 DIAGNOSIS — O24419 Gestational diabetes mellitus in pregnancy, unspecified control: Secondary | ICD-10-CM | POA: Insufficient documentation

## 2020-12-23 LAB — POCT URINALYSIS DIPSTICK OB
Glucose, UA: NEGATIVE
POC,PROTEIN,UA: NEGATIVE

## 2020-12-23 LAB — OB RESULTS CONSOLE GC/CHLAMYDIA: Gonorrhea: NEGATIVE

## 2020-12-23 NOTE — Progress Notes (Signed)
Routine Prenatal Care Visit  Subjective  Rebecca Gomez is a 30 y.o. G3P1011 at [redacted]w[redacted]d being seen today for ongoing prenatal care.  She is currently monitored for the following issues for this high-risk pregnancy and has History of cesarean delivery; Supervision of high-risk pregnancy, third trimester; Obesity affecting pregnancy; and Gestational diabetes on their problem list.  ----------------------------------------------------------------------------------- Patient reports no complaints.   Contractions: Irregular. Vag. Bleeding: None.  Movement: Present. Leaking Fluid denies.  GDM: did not bring log. States all values normal. Will send picture through MyChart. ----------------------------------------------------------------------------------- The following portions of the patient's history were reviewed and updated as appropriate: allergies, current medications, past family history, past medical history, past social history, past surgical history and problem list. Problem list updated.  Objective  Blood pressure 120/70, weight 203 lb (92.1 kg), last menstrual period 04/13/2020, unknown if currently breastfeeding. Pregravid weight 214 lb (97.1 kg) Total Weight Gain -11 lb (-4.99 kg) Urinalysis: Urine Protein Negative  Urine Glucose Negative  Fetal Status: Fetal Heart Rate (bpm): 140 Fundal Height: 37 cm Movement: Present  Presentation: Vertex  General:  Alert, oriented and cooperative. Patient is in no acute distress.  Skin: Skin is warm and dry. No rash noted.   Cardiovascular: Normal heart rate noted  Respiratory: Normal respiratory effort, no problems with respiration noted  Abdomen: Soft, gravid, appropriate for gestational age. Pain/Pressure: Present     Pelvic:  Cervical exam deferred      GBS/aptima collected.   Extremities: Normal range of motion.  Edema: None  Mental Status: Normal mood and affect. Normal behavior. Normal judgment and thought content.   Female chaperone present  for pelvic exam:   Assessment   30 y.o. G3P1011 at [redacted]w[redacted]d by  01/18/2021, by Last Menstrual Period presenting for routine prenatal visit  Plan   pregnancy3  Problems (from 04/13/20 to present)     Problem Noted Resolved   Gestational diabetes 12/23/2020 by Conard Novak, MD No   Obesity affecting pregnancy 11/26/2020 by Tresea Mall, CNM No   Supervision of high-risk pregnancy, third trimester 06/09/2020 by Nadara Mustard, MD No   Overview Addendum 11/11/2020  2:51 PM by Natale Milch, MD     Nursing Staff Provider  Office Location  [facility] Dating   LMP = 8w [place]  Language  English Anatomy US  [facility] nml  Flu Vaccine  02/2020 Genetic Screen  NIPS:MaternT- neg- female  TDaP vaccine   11/11/2020 Hgb A1C or  GTT Early :170.f/u test nml Third trimester : declined, is monitoring   Rhogam  n/a   LAB RESULTS   Feeding Plan  Blood Type O/Positive/-- (02/09 1130)   Contraception  Antibody Negative (02/09 1130)  Circumcision  Rubella Immune  Pediatrician   RPR neg  Support Person  HBsAg neg  Prenatal Classes  HIV neg    Varicella Immune  BTL Consent  GBS  (For PCN allergy, check sensitivities)        VBAC Consent  Desires TOLAC 11/11/2020 Pap  08/22/19 nml, repeat pp    Hgb Electro  n/a    CF Neg     SMA Neg                  Preterm labor symptoms and general obstetric precautions including but not limited to vaginal bleeding, contractions, leaking of fluid and fetal movement were reviewed in detail with the patient. Please refer to After Visit Summary for other counseling recommendations.   GBS/aptima today - scheduled c-section for 9/1.  Lengthy discussion re: IOL, labor, c-section. She would like to labor if occurs spontaneously. She is OK with c-section if not dilated significantly by 39 weeks. Discussed all this is pending good GDM control. She voiced understanding and will send BG log picture through MyChart.   Return in about 1 week (around 12/30/2020) for Routine  Prenatal Appointment.   Thomasene Mohair, MD, [facility], [facility] 12/23/2020 6:10 PM

## 2020-12-24 ENCOUNTER — Telehealth: Payer: Self-pay

## 2020-12-24 DIAGNOSIS — Z331 Pregnant state, incidental: Secondary | ICD-10-CM | POA: Diagnosis not present

## 2020-12-24 DIAGNOSIS — U071 COVID-19: Secondary | ICD-10-CM | POA: Diagnosis not present

## 2020-12-24 NOTE — Telephone Encounter (Signed)
Left a message for the patient to return the call.  

## 2020-12-25 LAB — CERVICOVAGINAL ANCILLARY ONLY
Chlamydia: NEGATIVE
Comment: NEGATIVE
Comment: NORMAL
Neisseria Gonorrhea: NEGATIVE

## 2020-12-26 LAB — STREP GP B NAA: Strep Gp B NAA: NEGATIVE

## 2020-12-30 ENCOUNTER — Other Ambulatory Visit: Payer: Self-pay

## 2020-12-30 ENCOUNTER — Ambulatory Visit (INDEPENDENT_AMBULATORY_CARE_PROVIDER_SITE_OTHER): Payer: BC Managed Care – PPO | Admitting: Obstetrics & Gynecology

## 2020-12-30 ENCOUNTER — Encounter: Payer: Self-pay | Admitting: Obstetrics & Gynecology

## 2020-12-30 VITALS — Wt 200.1 lb

## 2020-12-30 DIAGNOSIS — Z98891 History of uterine scar from previous surgery: Secondary | ICD-10-CM

## 2020-12-30 DIAGNOSIS — Z3A37 37 weeks gestation of pregnancy: Secondary | ICD-10-CM

## 2020-12-30 DIAGNOSIS — O0993 Supervision of high risk pregnancy, unspecified, third trimester: Secondary | ICD-10-CM

## 2020-12-30 DIAGNOSIS — O24410 Gestational diabetes mellitus in pregnancy, diet controlled: Secondary | ICD-10-CM

## 2020-12-30 DIAGNOSIS — E669 Obesity, unspecified: Secondary | ICD-10-CM

## 2020-12-30 DIAGNOSIS — O99213 Obesity complicating pregnancy, third trimester: Secondary | ICD-10-CM

## 2020-12-30 DIAGNOSIS — O34219 Maternal care for unspecified type scar from previous cesarean delivery: Secondary | ICD-10-CM

## 2020-12-30 NOTE — Progress Notes (Signed)
Virtual Visit via Telephone Note  I connected with patient on 12/30/20 at 10:40 AM EDT by telephone and verified that I am speaking with the correct person using two identifiers.   I discussed the limitations, risks, security and privacy concerns of performing an evaluation and management service by telephone and the availability of in person appointments. I also discussed with the patient that there may be a patient responsible charge related to this service. The patient expressed understanding and agreed to proceed.  The patient was at home (has COVID) I spoke with the patient from my  office  Rebecca Gomez is a 30 y.o. G3P1011 at [redacted]w[redacted]d being seen today for ongoing prenatal care.  She is currently monitored for the following issues for this high-risk pregnancy and has History of cesarean delivery; Supervision of high-risk pregnancy, third trimester; Obesity affecting pregnancy; and Gestational diabetes on their problem list.  ----------------------------------------------------------------------------------- Patient reports no complaints and normal BS .   Denies pain, VB, leaking of fluid.  ----------------------------------------------------------------------------------- The following portions of the patient's history were reviewed and updated as appropriate: allergies, current medications, past family history, past medical history, past social history, past surgical history and problem list. Problem list updated.   Objective  Weight 200 lb 1.6 oz (90.8 kg), last menstrual period 04/13/2020, unknown if currently breastfeeding. Pregravid weight 214 lb (97.1 kg) Total Weight Gain -13 lb 14.4 oz (-6.305 kg)  Physical Exam could not be performed. Because of the COVID-19 outbreak this visit was performed over the phone and not in person.   Assessment   30 y.o. G3P1011 at [redacted]w[redacted]d by  01/18/2021, by Last Menstrual Period presenting for routine prenatal visit  Plan   pregnancy3  Problems (from  04/13/20 to present)     Problem Noted Resolved   Gestational diabetes 12/23/2020 by Conard Novak, MD No   Obesity affecting pregnancy 11/26/2020 by Tresea Mall, CNM No   Supervision of high-risk pregnancy, third trimester 06/09/2020 by Nadara Mustard, MD No   Overview Addendum 11/11/2020  2:51 PM by Natale Milch, MD     Nursing Staff Provider  Office Location  [facility] Dating   LMP = 8w [place]  Language  English Anatomy US  [facility] nml  Flu Vaccine  02/2020 Genetic Screen  NIPS:MaternT- neg- female  TDaP vaccine   11/11/2020 Hgb A1C or  GTT Early :170.f/u test nml Third trimester : declined, is monitoring   Rhogam  n/a   LAB RESULTS   Feeding Plan  Blood Type O/Positive/-- (02/09 1130)   Contraception  Antibody Negative (02/09 1130)  Circumcision  Rubella Immune  Pediatrician   RPR neg  Support Person  HBsAg neg  Prenatal Classes  HIV neg    Varicella Immune  BTL Consent  GBS  (For PCN allergy, check sensitivities)        VBAC Consent  Desires TOLAC 11/11/2020 Pap  08/22/19 nml, repeat pp    Hgb Electro  n/a    CF Neg     SMA Neg                PNV, [facility]  Planning CS if no delivery by 01/14/21, scheduled w Dr Jean Rosenthal  Cont to monitor BS and diabetes  Gestational age appropriate obstetric precautions including but not limited to vaginal bleeding, contractions, leaking of fluid and fetal movement were reviewed in detail with the patient.     Follow Up Instructions: 1 week   I discussed the assessment and treatment plan with  the patient. The patient was provided an opportunity to ask questions and all were answered. The patient agreed with the plan and demonstrated an understanding of the instructions.   The patient was advised to call back or seek an in-person evaluation if the symptoms worsen or if the condition fails to improve as anticipated.  I provided 10 minutes of non-face-to-face time during this encounter.  Return for Keep appt next week.  Annamarie Major,  MD [facility], [facility] 12/30/2020 10:59 AM

## 2021-01-06 ENCOUNTER — Encounter: Payer: BC Managed Care – PPO | Admitting: Obstetrics & Gynecology

## 2021-01-07 ENCOUNTER — Encounter
Admission: RE | Admit: 2021-01-07 | Discharge: 2021-01-07 | Disposition: A | Discharge status: Home / Self Care | Payer: BC Managed Care – PPO | Source: Ambulatory Visit | Attending: Obstetrics and Gynecology | Admitting: Obstetrics and Gynecology

## 2021-01-07 ENCOUNTER — Other Ambulatory Visit: Payer: Self-pay

## 2021-01-07 HISTORY — DX: Type 2 diabetes mellitus without complications: E11.9

## 2021-01-07 NOTE — Patient Instructions (Addendum)
Your procedure is scheduled on: January 14, 2021 Thursday CHECK IN AT THE EMERGENCY DEPARTMENT AT 8:00 AM  REMEMBER: Instructions that are not followed completely may result in serious medical risk, up to and including death; or upon the discretion of your surgeon and anesthesiologist your surgery may need to be rescheduled.  DO NOT EAT OR DRINK after midnight the night before surgery.  No gum chewing, lozengers or hard candies.  TAKE THESE MEDICATIONS THE MORNING OF SURGERY WITH A SIP OF WATER: NONE  One week prior to surgery: Stop Anti-inflammatories (NSAIDS) such as Advil, Aleve, Ibuprofen, Motrin, Naproxen, Naprosyn and Aspirin based products such as Excedrin, Goodys Powder, BC Powder. Stop ANY OVER THE COUNTER supplements until after surgery. You may however, continue to take Tylenol if needed for pain up until the day of surgery.  No Alcohol for 24 hours before or after surgery.  No Smoking including e-cigarettes for 24 hours prior to surgery.  No chewable tobacco products for at least 6 hours prior to surgery.  No nicotine patches on the day of surgery.  Do not use any "recreational" drugs for at least a week prior to your surgery.  Please be advised that the combination of cocaine and anesthesia may have negative outcomes, up to and including death. If you test positive for cocaine, your surgery will be cancelled.  On the morning of surgery brush your teeth with toothpaste and water, you may rinse your mouth with mouthwash if you wish. Do not swallow any toothpaste or mouthwash.  Do not wear jewelry, make-up, hairpins, clips or nail polish.  Do not wear lotions, powders, or perfumes.   Do not shave body from the neck down 48 hours prior to surgery just in case you cut yourself which could leave a site for infection.  Also, freshly shaved skin may become irritated if using the CHG soap.  Contact lenses, hearing aids and dentures may not be worn into surgery.  Do not  bring valuables to the hospital. [facility] is not responsible for any missing/lost belongings or valuables.   Use CHG  wipes as directed on instruction sheet.  Notify your doctor if there is any change in your medical condition (cold, fever, infection).  Wear comfortable clothing (specific to your surgery type) to the hospital.  After surgery, you can help prevent lung complications by doing breathing exercises.  Take deep breaths and cough every 1-2 hours. Your doctor may order a device called an Incentive Spirometer to help you take deep breaths. When coughing or sneezing, hold a pillow firmly against your incision with both hands. This is called "splinting." Doing this helps protect your incision. It also decreases belly discomfort.  If you are being admitted to the hospital overnight, leave your suitcase in the car. After surgery it may be brought to your room.  If you are being discharged the day of surgery, you will not be allowed to drive home. You will need a responsible adult (18 years or older) to drive you home and stay with you that night.   If you are taking public transportation, you will need to have a responsible adult (18 years or older) with you. Please confirm with your physician that it is acceptable to use public transportation.   Please call the Pre-admissions Testing Dept. at [phone] if you have any questions about these instructions.  Surgery Visitation Policy:  Patients undergoing a surgery or procedure may have one family member or support person with them as long  as that person is not COVID-19 positive or experiencing its symptoms.  That person may remain in the waiting area during the procedure.  Inpatient Visitation:    Visiting hours are 7 a.m. to 8 p.m. Inpatients will be allowed two visitors daily. The visitors may change each day during the patient's stay. No visitors under the age of 25. Any visitor under the age of 31 must be accompanied by  an adult. The visitor must pass COVID-19 screenings, use hand sanitizer when entering and exiting the patient's room and wear a mask at all times, including in the patient's room. Patients must also wear a mask when staff or their visitor are in the room. Masking is required regardless of vaccination status.

## 2021-01-08 ENCOUNTER — Encounter: Payer: Self-pay | Admitting: Urgent Care

## 2021-01-08 ENCOUNTER — Encounter
Admission: RE | Admit: 2021-01-08 | Discharge: 2021-01-08 | Disposition: A | Discharge status: Home / Self Care | Payer: BC Managed Care – PPO | Source: Ambulatory Visit | Attending: Obstetrics and Gynecology | Admitting: Obstetrics and Gynecology

## 2021-01-08 ENCOUNTER — Ambulatory Visit (INDEPENDENT_AMBULATORY_CARE_PROVIDER_SITE_OTHER): Payer: BC Managed Care – PPO | Admitting: Obstetrics and Gynecology

## 2021-01-08 ENCOUNTER — Encounter: Payer: Self-pay | Admitting: Obstetrics and Gynecology

## 2021-01-08 VITALS — BP 126/84 | Ht 63.0 in | Wt 204.0 lb

## 2021-01-08 DIAGNOSIS — Z01812 Encounter for preprocedural laboratory examination: Secondary | ICD-10-CM | POA: Diagnosis not present

## 2021-01-08 DIAGNOSIS — Z98891 History of uterine scar from previous surgery: Secondary | ICD-10-CM

## 2021-01-08 DIAGNOSIS — Z3A38 38 weeks gestation of pregnancy: Secondary | ICD-10-CM

## 2021-01-08 DIAGNOSIS — O0993 Supervision of high risk pregnancy, unspecified, third trimester: Secondary | ICD-10-CM

## 2021-01-08 DIAGNOSIS — O24410 Gestational diabetes mellitus in pregnancy, diet controlled: Secondary | ICD-10-CM

## 2021-01-08 DIAGNOSIS — O99213 Obesity complicating pregnancy, third trimester: Secondary | ICD-10-CM

## 2021-01-08 LAB — RAPID HIV SCREEN (HIV 1/2 AB+AG)
HIV 1/2 Antibodies: NONREACTIVE
HIV-1 P24 Antigen - HIV24: NONREACTIVE

## 2021-01-08 LAB — CBC
HCT: 33.6 % — ABNORMAL LOW (ref 36.0–46.0)
Hemoglobin: 10.6 g/dL — ABNORMAL LOW (ref 12.0–15.0)
MCH: 25.7 pg — ABNORMAL LOW (ref 26.0–34.0)
MCHC: 31.5 g/dL (ref 30.0–36.0)
MCV: 81.4 fL (ref 80.0–100.0)
Platelets: 281 10*3/uL (ref 150–400)
RBC: 4.13 MIL/uL (ref 3.87–5.11)
RDW: 15.2 % (ref 11.5–15.5)
WBC: 8.7 10*3/uL (ref 4.0–10.5)
nRBC: 0 % (ref 0.0–0.2)

## 2021-01-08 LAB — TYPE AND SCREEN
ABO/RH(D): O POS
Antibody Screen: NEGATIVE
Extend sample reason: UNDETERMINED

## 2021-01-08 LAB — OB RESULTS CONSOLE HIV ANTIBODY (ROUTINE TESTING): HIV: NONREACTIVE

## 2021-01-08 NOTE — Progress Notes (Signed)
OB History & Physical   History of Present Illness:  Chief Complaint: pre-op for cesarean section  HPI:  Rebecca Gomez is a 30 y.o. G56P1011 female at [redacted]w[redacted]d dated by LMP = 8 week ultrasound.  Her pregnancy has been complicated by obesity, gestational diabetes, history of cesarean section .    She not infrequent contractions.   She denies leakage of fluid.   She denies vaginal bleeding.   She reports fetal movement.    Total weight gain for pregnancy: -13 lb 14.4 oz (-6.305 kg)   She has not been keeping her BG log since she recently had COVID. Her BG noted this AM was 85.   Obstetrical Problem List: pregnancy3  Problems (from 04/13/20 to present)     Problem Noted Resolved   Gestational diabetes 12/23/2020 by Conard Novak, MD No   Obesity affecting pregnancy 11/26/2020 by Tresea Mall, CNM No   Supervision of high-risk pregnancy, third trimester 06/09/2020 by Nadara Mustard, MD No   Overview Addendum 11/11/2020  2:51 PM by Natale Milch, MD     Nursing Staff Provider  Office Location  [facility] Dating   LMP = 8w [place]  Language  English Anatomy US  [facility] nml  Flu Vaccine  02/2020 Genetic Screen  NIPS:MaternT- neg- female  TDaP vaccine   11/11/2020 Hgb A1C or  GTT Early :170.f/u test nml Third trimester : declined, is monitoring   Rhogam  n/a   LAB RESULTS   Feeding Plan  Blood Type O/Positive/-- (02/09 1130)   Contraception  Antibody Negative (02/09 1130)  Circumcision  Rubella Immune  Pediatrician   RPR neg  Support Person  HBsAg neg  Prenatal Classes  HIV neg    Varicella Immune  BTL Consent  GBS  (For PCN allergy, check sensitivities)        VBAC Consent  Desires TOLAC 11/11/2020 Pap  08/22/19 nml, repeat pp    Hgb Electro  n/a    CF Neg     SMA Neg                  Maternal Medical History:   Past Medical History:  Diagnosis Date   Diabetes mellitus without complication (HCC)    GESTATIONAL DIABETIC   Medical history non-contributory    Miscarriage      Past Surgical History:  Procedure Laterality Date   CESAREAN SECTION N/A 02/20/2017   Procedure: CESAREAN SECTION;  Surgeon: Nadara Mustard, MD;  Location: [facility] ORS;  Service: Obstetrics;  Laterality: N/A;  Time of Birth: 11:45 Sex: Female Wt:    DILATION AND EVACUATION N/A 03/12/2020   Procedure: DILATATION AND EVACUATION;  Surgeon: Natale Milch, MD;  Location: [facility] ORS;  Service: Gynecology;  Laterality: N/A;   NO PAST SURGERIES      No Known Allergies  Prior to Admission medications   Medication Sig Start Date End Date Taking? Authorizing Provider  acetaminophen (TYLENOL) 500 MG tablet Take 1,000 mg by mouth every 8 (eight) hours as needed for moderate pain.    [provider]  calcium carbonate (TUMS EX) 750 MG chewable tablet Chew 2 tablets by mouth daily as needed for heartburn.    [provider]  ondansetron (ZOFRAN-ODT) 4 MG disintegrating tablet TAKE 1 TABLET BY MOUTH EVERY 6 HOURS AS NEEDED FOR NAUSEA 12/15/20   Nadara Mustard, MD  Prenatal Vit-Fe Fumarate-FA (MULTIVITAMIN-PRENATAL) 27-0.8 MG TABS tablet Take 1 tablet by mouth daily at 12 noon.  [provider]    OB History  Gravida Para Term Preterm AB Living  3 1 1   1 1   SAB IAB Ectopic Multiple Live Births  1     0 1    # Outcome Date GA Lbr Len/2nd Weight Sex Delivery Anes PTL Lv  3 Current           2 Term 02/20/17 [redacted]w[redacted]d  7 lb 4.4 oz (3.3 kg) F CS-LTranv EPI, Gen  LIV  1 SAB             Prenatal care site: [facility]  Social History: She  reports that she has never smoked. She has never used smokeless tobacco. She reports that she does not drink alcohol and does not use drugs.  Family History: family history includes Diabetes in her mother; Hypertension in her mother.   Review of Systems:  Review of Systems  Constitutional: Negative.   HENT: Negative.    Eyes: Negative.   Respiratory: Negative.    Cardiovascular: Negative.   Gastrointestinal: Negative.    Genitourinary: Negative.   Musculoskeletal: Negative.   Skin: Negative.   Neurological: Negative.   Psychiatric/Behavioral: Negative.      Physical Exam:  BP 126/84   Ht 5\' 3"  (1.6 m)   Wt 204 lb (92.5 kg)   LMP 04/13/2020   BMI 36.14 kg/m   Physical Exam Constitutional:      General: She is not in acute distress.    Appearance: Normal appearance. She is well-developed.  HENT:     Head: Normocephalic and atraumatic.  Eyes:     General: No scleral icterus.    Conjunctiva/sclera: Conjunctivae normal.  Cardiovascular:     Rate and Rhythm: Normal rate and regular rhythm.     Heart sounds: No murmur heard.   No friction rub. No gallop.  Pulmonary:     Effort: Pulmonary effort is normal. No respiratory distress.     Breath sounds: Normal breath sounds. No wheezing or rales.  Abdominal:     General: Bowel sounds are normal. There is no distension.     Palpations: Abdomen is soft. There is no mass.     Tenderness: There is no abdominal tenderness. There is no guarding or rebound.  Musculoskeletal:        General: Normal range of motion.     Cervical back: Normal range of motion and neck supple.  Neurological:     General: No focal deficit present.     Mental Status: She is alert and oriented to person, place, and time.     Cranial Nerves: No cranial nerve deficit.  Skin:    General: Skin is warm and dry.     Findings: No erythema.  Psychiatric:        Mood and Affect: Mood normal.        Behavior: Behavior normal.        Judgment: Judgment normal.   FHR: 140 bpm  Lab Results  Component Value Date   SARSCOV2NAA NEGATIVE 03/12/2020    Assessment:  Rebecca Gomez is a 30 y.o. G41P1011 female at [redacted]w[redacted]d with history of c-section, here for repeat. If she presents to hospital on day of surgery with sufficient dilation, she may elect to undergo augmentation for attempted vaginal delivery.   Plan:  Admit to Labor & Delivery  CBC, T&S, NPO, IVF GBS negative on 12/23/2020.    Fetwal well-being: reassuring    [redacted]w[redacted]d, MD 01/08/2021 10:23 AM

## 2021-01-08 NOTE — H&P (View-Only) (Signed)
OB History & Physical   History of Present Illness:  Chief Complaint: pre-op for cesarean section  HPI:  Rebecca Gomez is a 30 y.o. G3P1011 female at [redacted]w[redacted]d dated by LMP = 8 week ultrasound.  Her pregnancy has been complicated by obesity, gestational diabetes, history of cesarean section .    She not infrequent contractions.   She denies leakage of fluid.   She denies vaginal bleeding.   She reports fetal movement.    Total weight gain for pregnancy: -13 lb 14.4 oz (-6.305 kg)   She has not been keeping her BG log since she recently had COVID. Her BG noted this AM was 85.   Obstetrical Problem List: pregnancy3  Problems (from 04/13/20 to present)     Problem Noted Resolved   Gestational diabetes 12/23/2020 by Nikolai Wilczak D, MD No   Obesity affecting pregnancy 11/26/2020 by Gledhill, Jane, CNM No   Supervision of high-risk pregnancy, third trimester 06/09/2020 by Harris, Robert P, MD No   Overview Addendum 11/11/2020  2:51 PM by Schuman, Christanna R, MD     Nursing Staff Provider  Office Location  [facility] Dating   LMP = 8w US  Language  English Anatomy US  [facility] nml  Flu Vaccine  02/2020 Genetic Screen  NIPS:MaternT- neg- female  TDaP vaccine   11/11/2020 Hgb A1C or  GTT Early :170.f/u test nml Third trimester : declined, is monitoring   Rhogam  n/a   LAB RESULTS   Feeding Plan  Blood Type O/Positive/-- (02/09 1130)   Contraception  Antibody Negative (02/09 1130)  Circumcision  Rubella Immune  Pediatrician   RPR neg  Support Person  HBsAg neg  Prenatal Classes  HIV neg    Varicella Immune  BTL Consent  GBS  (For PCN allergy, check sensitivities)        VBAC Consent  Desires TOLAC 11/11/2020 Pap  08/22/19 nml, repeat pp    Hgb Electro  n/a    CF Neg     SMA Neg                  Maternal Medical History:   Past Medical History:  Diagnosis Date   Diabetes mellitus without complication (HCC)    GESTATIONAL DIABETIC   Medical history non-contributory    Miscarriage      Past Surgical History:  Procedure Laterality Date   CESAREAN SECTION N/A 02/20/2017   Procedure: CESAREAN SECTION;  Surgeon: Harris, Robert P, MD;  Location: [facility] ORS;  Service: Obstetrics;  Laterality: N/A;  Time of Birth: 11:45 Sex: Female Wt:    DILATION AND EVACUATION N/A 03/12/2020   Procedure: DILATATION AND EVACUATION;  Surgeon: Schuman, Christanna R, MD;  Location: [facility] ORS;  Service: Gynecology;  Laterality: N/A;   NO PAST SURGERIES      No Known Allergies  Prior to Admission medications   Medication Sig Start Date End Date Taking? Authorizing Provider  acetaminophen (TYLENOL) 500 MG tablet Take 1,000 mg by mouth every 8 (eight) hours as needed for moderate pain.    [provider]  calcium carbonate (TUMS EX) 750 MG chewable tablet Chew 2 tablets by mouth daily as needed for heartburn.    [provider]  ondansetron (ZOFRAN-ODT) 4 MG disintegrating tablet TAKE 1 TABLET BY MOUTH EVERY 6 HOURS AS NEEDED FOR NAUSEA 12/15/20   Harris, Robert P, MD  Prenatal Vit-Fe Fumarate-FA (MULTIVITAMIN-PRENATAL) 27-0.8 MG TABS tablet Take 1 tablet by mouth daily at 12 noon.      [provider]    OB History  Gravida Para Term Preterm AB Living  3 1 1   1 1   SAB IAB Ectopic Multiple Live Births  1     0 1    # Outcome Date GA Lbr Len/2nd Weight Sex Delivery Anes PTL Lv  3 Current           2 Term 02/20/17 [redacted]w[redacted]d  7 lb 4.4 oz (3.3 kg) F CS-LTranv EPI, Gen  LIV  1 SAB             Prenatal care site: [facility]  Social History: She  reports that she has never smoked. She has never used smokeless tobacco. She reports that she does not drink alcohol and does not use drugs.  Family History: family history includes Diabetes in her mother; Hypertension in her mother.   Review of Systems:  Review of Systems  Constitutional: Negative.   HENT: Negative.    Eyes: Negative.   Respiratory: Negative.    Cardiovascular: Negative.   Gastrointestinal: Negative.    Genitourinary: Negative.   Musculoskeletal: Negative.   Skin: Negative.   Neurological: Negative.   Psychiatric/Behavioral: Negative.      Physical Exam:  BP 126/84   Ht 5\' 3"  (1.6 m)   Wt 204 lb (92.5 kg)   LMP 04/13/2020   BMI 36.14 kg/m   Physical Exam Constitutional:      General: She is not in acute distress.    Appearance: Normal appearance. She is well-developed.  HENT:     Head: Normocephalic and atraumatic.  Eyes:     General: No scleral icterus.    Conjunctiva/sclera: Conjunctivae normal.  Cardiovascular:     Rate and Rhythm: Normal rate and regular rhythm.     Heart sounds: No murmur heard.   No friction rub. No gallop.  Pulmonary:     Effort: Pulmonary effort is normal. No respiratory distress.     Breath sounds: Normal breath sounds. No wheezing or rales.  Abdominal:     General: Bowel sounds are normal. There is no distension.     Palpations: Abdomen is soft. There is no mass.     Tenderness: There is no abdominal tenderness. There is no guarding or rebound.  Musculoskeletal:        General: Normal range of motion.     Cervical back: Normal range of motion and neck supple.  Neurological:     General: No focal deficit present.     Mental Status: She is alert and oriented to person, place, and time.     Cranial Nerves: No cranial nerve deficit.  Skin:    General: Skin is warm and dry.     Findings: No erythema.  Psychiatric:        Mood and Affect: Mood normal.        Behavior: Behavior normal.        Judgment: Judgment normal.   FHR: 140 bpm  Lab Results  Component Value Date   SARSCOV2NAA NEGATIVE 03/12/2020    Assessment:  Rebecca Gomez is a 30 y.o. G41P1011 female at [redacted]w[redacted]d with history of c-section, here for repeat. If she presents to hospital on day of surgery with sufficient dilation, she may elect to undergo augmentation for attempted vaginal delivery.   Plan:  Admit to Labor & Delivery  CBC, T&S, NPO, IVF GBS negative on 12/23/2020.    Fetwal well-being: reassuring    [redacted]w[redacted]d, MD 01/08/2021 10:23 AM

## 2021-01-09 LAB — RPR: RPR Ser Ql: NONREACTIVE

## 2021-01-13 MED ORDER — BUPIVACAINE 0.25 % ON-Q PUMP DUAL CATH 400 ML
400.0000 mL | INJECTION | Status: DC
  Filled 2021-01-13: qty 400

## 2021-01-13 MED ORDER — BUPIVACAINE HCL (PF) 0.5 % IJ SOLN: 5.0000 mL | Freq: Once | INTRAMUSCULAR | Status: DC

## 2021-01-13 MED ORDER — CEFAZOLIN SODIUM-DEXTROSE 2-4 GM/100ML-% IV SOLN
2.0000 g | INTRAVENOUS | Status: AC
  Administered 2021-01-14: 2 g via INTRAVENOUS
  Filled 2021-01-13: qty 100

## 2021-01-13 MED ORDER — BUPIVACAINE HCL (PF) 0.5 % IJ SOLN
5.0000 mL | Freq: Once | INTRAMUSCULAR | Status: DC
  Filled 2021-01-13: qty 10

## 2021-01-14 ENCOUNTER — Other Ambulatory Visit: Payer: Self-pay

## 2021-01-14 ENCOUNTER — Inpatient Hospital Stay: Payer: BC Managed Care – PPO | Admitting: Anesthesiology

## 2021-01-14 ENCOUNTER — Encounter: Payer: Self-pay | Admitting: Obstetrics and Gynecology

## 2021-01-14 ENCOUNTER — Inpatient Hospital Stay
Admission: RE | Admit: 2021-01-14 | Discharge: 2021-01-16 | DRG: 788 | Disposition: A | Discharge status: Home / Self Care | Payer: BC Managed Care – PPO | Attending: Obstetrics and Gynecology | Admitting: Obstetrics and Gynecology

## 2021-01-14 ENCOUNTER — Encounter
Admission: RE | Disposition: A | Discharge status: Home / Self Care | Payer: Self-pay | Source: Home / Self Care | Attending: Obstetrics and Gynecology

## 2021-01-14 DIAGNOSIS — O34219 Maternal care for unspecified type scar from previous cesarean delivery: Secondary | ICD-10-CM | POA: Diagnosis not present

## 2021-01-14 DIAGNOSIS — O34211 Maternal care for low transverse scar from previous cesarean delivery: Secondary | ICD-10-CM | POA: Diagnosis not present

## 2021-01-14 DIAGNOSIS — O99210 Obesity complicating pregnancy, unspecified trimester: Secondary | ICD-10-CM | POA: Diagnosis present

## 2021-01-14 DIAGNOSIS — O24419 Gestational diabetes mellitus in pregnancy, unspecified control: Secondary | ICD-10-CM | POA: Diagnosis present

## 2021-01-14 DIAGNOSIS — O24420 Gestational diabetes mellitus in childbirth, diet controlled: Secondary | ICD-10-CM | POA: Diagnosis not present

## 2021-01-14 DIAGNOSIS — Z3A39 39 weeks gestation of pregnancy: Secondary | ICD-10-CM | POA: Diagnosis not present

## 2021-01-14 DIAGNOSIS — Z3A38 38 weeks gestation of pregnancy: Secondary | ICD-10-CM

## 2021-01-14 DIAGNOSIS — Z23 Encounter for immunization: Secondary | ICD-10-CM | POA: Diagnosis not present

## 2021-01-14 DIAGNOSIS — O134 Gestational [pregnancy-induced] hypertension without significant proteinuria, complicating childbirth: Secondary | ICD-10-CM | POA: Diagnosis present

## 2021-01-14 DIAGNOSIS — O24410 Gestational diabetes mellitus in pregnancy, diet controlled: Secondary | ICD-10-CM

## 2021-01-14 DIAGNOSIS — Z0542 Observation and evaluation of newborn for suspected metabolic condition ruled out: Secondary | ICD-10-CM | POA: Diagnosis not present

## 2021-01-14 DIAGNOSIS — O99214 Obesity complicating childbirth: Secondary | ICD-10-CM | POA: Diagnosis not present

## 2021-01-14 DIAGNOSIS — O0993 Supervision of high risk pregnancy, unspecified, third trimester: Secondary | ICD-10-CM

## 2021-01-14 DIAGNOSIS — O99213 Obesity complicating pregnancy, third trimester: Secondary | ICD-10-CM

## 2021-01-14 DIAGNOSIS — Z98891 History of uterine scar from previous surgery: Secondary | ICD-10-CM

## 2021-01-14 LAB — CBC WITH DIFFERENTIAL/PLATELET
Abs Immature Granulocytes: 0.02 10*3/uL (ref 0.00–0.07)
Basophils Absolute: 0 10*3/uL (ref 0.0–0.1)
Basophils Relative: 0 %
Eosinophils Absolute: 0.1 10*3/uL (ref 0.0–0.5)
Eosinophils Relative: 1 %
HCT: 35.6 % — ABNORMAL LOW (ref 36.0–46.0)
Hemoglobin: 11.6 g/dL — ABNORMAL LOW (ref 12.0–15.0)
Immature Granulocytes: 0 %
Lymphocytes Relative: 25 %
Lymphs Abs: 2 10*3/uL (ref 0.7–4.0)
MCH: 26.4 pg (ref 26.0–34.0)
MCHC: 32.6 g/dL (ref 30.0–36.0)
MCV: 81.1 fL (ref 80.0–100.0)
Monocytes Absolute: 0.5 10*3/uL (ref 0.1–1.0)
Monocytes Relative: 6 %
Neutro Abs: 5.6 10*3/uL (ref 1.7–7.7)
Neutrophils Relative %: 68 %
Platelets: 291 10*3/uL (ref 150–400)
RBC: 4.39 MIL/uL (ref 3.87–5.11)
RDW: 15.6 % — ABNORMAL HIGH (ref 11.5–15.5)
WBC: 8.2 10*3/uL (ref 4.0–10.5)
nRBC: 0 % (ref 0.0–0.2)

## 2021-01-14 LAB — TYPE AND SCREEN
ABO/RH(D): O POS
Antibody Screen: NEGATIVE

## 2021-01-14 LAB — GLUCOSE, CAPILLARY: Glucose-Capillary: 68 mg/dL — ABNORMAL LOW (ref 70–99)

## 2021-01-14 SURGERY — Surgical Case
Anesthesia: Spinal

## 2021-01-14 MED ORDER — OXYCODONE-ACETAMINOPHEN 5-325 MG PO TABS
1.0000 | ORAL_TABLET | ORAL | Status: DC | PRN
Start: 2021-01-15 — End: 2021-01-16
  Administered 2021-01-15 – 2021-01-16 (×3): 1 via ORAL
  Filled 2021-01-14 (×3): qty 1

## 2021-01-14 MED ORDER — NALOXONE HCL 4 MG/10ML IJ SOLN
1.0000 ug/kg/h | INTRAVENOUS | Status: DC | PRN
  Filled 2021-01-14: qty 5

## 2021-01-14 MED ORDER — PRENATAL MULTIVITAMIN CH
1.0000 | ORAL_TABLET | Freq: Every day | ORAL | Status: DC
  Administered 2021-01-15 – 2021-01-16 (×2): 1 via ORAL
  Filled 2021-01-14 (×3): qty 1

## 2021-01-14 MED ORDER — FERROUS SULFATE 325 (65 FE) MG PO TABS
325.0000 mg | ORAL_TABLET | Freq: Two times a day (BID) | ORAL | Status: DC
  Administered 2021-01-14 – 2021-01-16 (×4): 325 mg via ORAL
  Filled 2021-01-14 (×4): qty 1

## 2021-01-14 MED ORDER — SIMETHICONE 80 MG PO CHEW
80.0000 mg | CHEWABLE_TABLET | Freq: Three times a day (TID) | ORAL | Status: DC
  Administered 2021-01-14 – 2021-01-16 (×5): 80 mg via ORAL
  Filled 2021-01-14 (×5): qty 1

## 2021-01-14 MED ORDER — LACTATED RINGERS IV SOLN: INTRAVENOUS | Status: DC

## 2021-01-14 MED ORDER — SODIUM CHLORIDE 0.9% FLUSH: 3.0000 mL | INTRAVENOUS | Status: DC | PRN

## 2021-01-14 MED ORDER — FENTANYL CITRATE (PF) 100 MCG/2ML IJ SOLN
INTRAMUSCULAR | Status: DC | PRN
  Administered 2021-01-14: 15 ug via INTRATHECAL

## 2021-01-14 MED ORDER — SOD CITRATE-CITRIC ACID 500-334 MG/5ML PO SOLN
ORAL | Status: AC
  Filled 2021-01-14: qty 15

## 2021-01-14 MED ORDER — DIPHENHYDRAMINE HCL 50 MG/ML IJ SOLN: 12.5000 mg | INTRAMUSCULAR | Status: DC | PRN

## 2021-01-14 MED ORDER — SENNOSIDES-DOCUSATE SODIUM 8.6-50 MG PO TABS
2.0000 | ORAL_TABLET | ORAL | Status: DC
  Administered 2021-01-15: 2 via ORAL
  Filled 2021-01-14: qty 2

## 2021-01-14 MED ORDER — CHLORHEXIDINE GLUCONATE 0.12 % MT SOLN
15.0000 mL | Freq: Once | OROMUCOSAL | Status: AC
  Administered 2021-01-14: 15 mL via OROMUCOSAL
  Filled 2021-01-14: qty 15

## 2021-01-14 MED ORDER — OXYTOCIN-SODIUM CHLORIDE 30-0.9 UT/500ML-% IV SOLN
INTRAVENOUS | Status: AC
  Filled 2021-01-14: qty 1000

## 2021-01-14 MED ORDER — OXYTOCIN-SODIUM CHLORIDE 30-0.9 UT/500ML-% IV SOLN
INTRAVENOUS | Status: DC | PRN
  Administered 2021-01-14: 500 mL via INTRAVENOUS

## 2021-01-14 MED ORDER — FENTANYL CITRATE (PF) 100 MCG/2ML IJ SOLN
INTRAMUSCULAR | Status: AC
  Filled 2021-01-14: qty 2

## 2021-01-14 MED ORDER — IBUPROFEN 600 MG PO TABS
600.0000 mg | ORAL_TABLET | Freq: Four times a day (QID) | ORAL | Status: DC
  Administered 2021-01-15 – 2021-01-16 (×5): 600 mg via ORAL
  Filled 2021-01-14 (×5): qty 1

## 2021-01-14 MED ORDER — NALBUPHINE HCL 10 MG/ML IJ SOLN: 5.0000 mg | Freq: Once | INTRAMUSCULAR | Status: DC | PRN

## 2021-01-14 MED ORDER — LACTATED RINGERS IV BOLUS: 500.0000 mL | Freq: Once | INTRAVENOUS | Status: DC

## 2021-01-14 MED ORDER — PHENYLEPHRINE HCL (PRESSORS) 10 MG/ML IV SOLN
INTRAVENOUS | Status: DC | PRN
  Administered 2021-01-14 (×2): 50 ug via INTRAVENOUS
  Administered 2021-01-14: 100 ug via INTRAVENOUS

## 2021-01-14 MED ORDER — DIPHENHYDRAMINE HCL 25 MG PO CAPS: 25.0000 mg | ORAL_CAPSULE | Freq: Four times a day (QID) | ORAL | Status: DC | PRN

## 2021-01-14 MED ORDER — GLYCOPYRROLATE 0.2 MG/ML IJ SOLN
INTRAMUSCULAR | Status: DC | PRN
  Administered 2021-01-14: .2 mg via INTRAVENOUS

## 2021-01-14 MED ORDER — DIBUCAINE (PERIANAL) 1 % EX OINT: 1.0000 "application " | TOPICAL_OINTMENT | CUTANEOUS | Status: DC | PRN

## 2021-01-14 MED ORDER — EPHEDRINE SULFATE 50 MG/ML IJ SOLN
INTRAMUSCULAR | Status: DC | PRN
  Administered 2021-01-14: 10 mg via INTRAVENOUS

## 2021-01-14 MED ORDER — PHENYLEPHRINE HCL (PRESSORS) 10 MG/ML IV SOLN
INTRAVENOUS | Status: AC
  Filled 2021-01-14: qty 1

## 2021-01-14 MED ORDER — KETOROLAC TROMETHAMINE 30 MG/ML IJ SOLN
INTRAMUSCULAR | Status: DC | PRN
Start: 2021-01-14 — End: 2021-01-14
  Administered 2021-01-14: 30 mg via INTRAVENOUS

## 2021-01-14 MED ORDER — MORPHINE SULFATE (PF) 0.5 MG/ML IJ SOLN
INTRAMUSCULAR | Status: AC
  Filled 2021-01-14: qty 10

## 2021-01-14 MED ORDER — NALBUPHINE HCL 10 MG/ML IJ SOLN: 5.0000 mg | INTRAMUSCULAR | Status: DC | PRN

## 2021-01-14 MED ORDER — LIDOCAINE HCL (PF) 1 % IJ SOLN
INTRAMUSCULAR | Status: DC | PRN
  Administered 2021-01-14: 3 mL

## 2021-01-14 MED ORDER — SOD CITRATE-CITRIC ACID 500-334 MG/5ML PO SOLN
30.0000 mL | ORAL | Status: AC
  Administered 2021-01-14: 30 mL via ORAL

## 2021-01-14 MED ORDER — IBUPROFEN 600 MG PO TABS
600.0000 mg | ORAL_TABLET | Freq: Four times a day (QID) | ORAL | Status: DC | PRN
  Administered 2021-01-14 – 2021-01-15 (×3): 600 mg via ORAL
  Filled 2021-01-14 (×3): qty 1

## 2021-01-14 MED ORDER — ACETAMINOPHEN 500 MG PO TABS
1000.0000 mg | ORAL_TABLET | Freq: Four times a day (QID) | ORAL | Status: AC
  Administered 2021-01-14 – 2021-01-15 (×4): 1000 mg via ORAL
  Filled 2021-01-14 (×4): qty 2

## 2021-01-14 MED ORDER — COCONUT OIL OIL
1.0000 "application " | TOPICAL_OIL | Status: DC | PRN
  Filled 2021-01-14 (×2): qty 120

## 2021-01-14 MED ORDER — MENTHOL 3 MG MT LOZG
1.0000 | LOZENGE | OROMUCOSAL | Status: DC | PRN
  Filled 2021-01-14: qty 9

## 2021-01-14 MED ORDER — MORPHINE SULFATE (PF) 0.5 MG/ML IJ SOLN
INTRAMUSCULAR | Status: DC | PRN
  Administered 2021-01-14: .1 mg via INTRATHECAL

## 2021-01-14 MED ORDER — WITCH HAZEL-GLYCERIN EX PADS: 1.0000 "application " | MEDICATED_PAD | CUTANEOUS | Status: DC | PRN

## 2021-01-14 MED ORDER — BUPIVACAINE IN DEXTROSE 0.75-8.25 % IT SOLN
INTRATHECAL | Status: DC | PRN
  Administered 2021-01-14: 1.5 mL via INTRATHECAL

## 2021-01-14 MED ORDER — ORAL CARE MOUTH RINSE: 15.0000 mL | Freq: Once | OROMUCOSAL | Status: AC

## 2021-01-14 MED ORDER — ONDANSETRON HCL 4 MG/2ML IJ SOLN
INTRAMUSCULAR | Status: DC | PRN
  Administered 2021-01-14: 4 mg via INTRAVENOUS

## 2021-01-14 MED ORDER — BUPIVACAINE HCL (PF) 0.5 % IJ SOLN
INTRAMUSCULAR | Status: AC
  Filled 2021-01-14: qty 30

## 2021-01-14 MED ORDER — OXYCODONE-ACETAMINOPHEN 5-325 MG PO TABS
2.0000 | ORAL_TABLET | ORAL | Status: DC | PRN
Start: 2021-01-15 — End: 2021-01-16

## 2021-01-14 MED ORDER — NALOXONE HCL 0.4 MG/ML IJ SOLN: 0.4000 mg | INTRAMUSCULAR | Status: DC | PRN

## 2021-01-14 MED ORDER — OXYTOCIN-SODIUM CHLORIDE 30-0.9 UT/500ML-% IV SOLN
2.5000 [IU]/h | INTRAVENOUS | Status: AC
  Administered 2021-01-14: 2.5 [IU]/h via INTRAVENOUS

## 2021-01-14 MED ORDER — BUPIVACAINE HCL (PF) 0.5 % IJ SOLN
INTRAMUSCULAR | Status: DC | PRN
  Administered 2021-01-14: 10 mL

## 2021-01-14 MED ORDER — SODIUM CHLORIDE 0.9 % IV SOLN
INTRAVENOUS | Status: DC | PRN
  Administered 2021-01-14: 30 ug/min via INTRAVENOUS

## 2021-01-14 MED ORDER — ONDANSETRON HCL 4 MG/2ML IJ SOLN: 4.0000 mg | Freq: Three times a day (TID) | INTRAMUSCULAR | Status: DC | PRN

## 2021-01-14 MED ORDER — DIPHENHYDRAMINE HCL 25 MG PO CAPS: 25.0000 mg | ORAL_CAPSULE | ORAL | Status: DC | PRN

## 2021-01-14 MED ORDER — SCOPOLAMINE 1 MG/3DAYS TD PT72: 1.0000 | MEDICATED_PATCH | Freq: Once | TRANSDERMAL | Status: DC

## 2021-01-14 SURGICAL SUPPLY — 34 items
CATH KIT ON-Q SILVERSOAK 5 (CATHETERS) ×2 IMPLANT
CATH KIT ON-Q SILVERSOAK 5IN (CATHETERS) ×4 IMPLANT
DERMABOND ADVANCED (GAUZE/BANDAGES/DRESSINGS) ×1
DERMABOND ADVANCED .7 DNX12 (GAUZE/BANDAGES/DRESSINGS) ×1 IMPLANT
DRSG OPSITE POSTOP 4X10 (GAUZE/BANDAGES/DRESSINGS) ×2 IMPLANT
DRSG TELFA 3X8 NADH (GAUZE/BANDAGES/DRESSINGS) ×2 IMPLANT
ELECT CAUTERY BLADE 6.4 (BLADE) ×2 IMPLANT
ELECT REM PT RETURN 9FT ADLT (ELECTROSURGICAL) ×2
ELECTRODE REM PT RTRN 9FT ADLT (ELECTROSURGICAL) ×1 IMPLANT
GAUZE SPONGE 4X4 12PLY STRL (GAUZE/BANDAGES/DRESSINGS) ×2 IMPLANT
GLOVE SURG ENC MOIS LTX SZ7 (GLOVE) ×2 IMPLANT
GLOVE SURG UNDER LTX SZ7.5 (GLOVE) ×2 IMPLANT
GOWN STRL REUS W/ TWL LRG LVL3 (GOWN DISPOSABLE) ×3 IMPLANT
GOWN STRL REUS W/TWL LRG LVL3 (GOWN DISPOSABLE) ×3
MANIFOLD NEPTUNE II (INSTRUMENTS) ×2 IMPLANT
MAT PREVALON FULL STRYKER (MISCELLANEOUS) ×2 IMPLANT
NS IRRIG 1000ML POUR BTL (IV SOLUTION) ×2 IMPLANT
PACK C SECTION AR (MISCELLANEOUS) ×2 IMPLANT
PAD DRESSING TELFA 3X8 NADH (GAUZE/BANDAGES/DRESSINGS) ×1 IMPLANT
PAD OB MATERNITY 4.3X12.25 (PERSONAL CARE ITEMS) ×4 IMPLANT
PAD PREP 24X41 OB/GYN DISP (PERSONAL CARE ITEMS) ×2 IMPLANT
PENCIL SMOKE EVACUATOR (MISCELLANEOUS) ×2 IMPLANT
SCRUB EXIDINE 4% CHG 4OZ (MISCELLANEOUS) ×2 IMPLANT
STRIP CLOSURE SKIN 1/2X4 (GAUZE/BANDAGES/DRESSINGS) ×2 IMPLANT
SUT MNCRL 4-0 (SUTURE) ×1
SUT MNCRL 4-0 27XMFL (SUTURE) ×1
SUT PDS AB 1 TP1 96 (SUTURE) ×2 IMPLANT
SUT PLAIN GUT 0 (SUTURE) IMPLANT
SUT VIC AB 0 CTX 36 (SUTURE) ×2
SUT VIC AB 0 CTX36XBRD ANBCTRL (SUTURE) ×2 IMPLANT
SUT VICRYL 3-0 (SUTURE) ×1 IMPLANT
SUTURE MNCRL 4-0 27XMF (SUTURE) ×1 IMPLANT
SWABSTK COMLB BENZOIN TINCTURE (MISCELLANEOUS) ×2 IMPLANT
WATER STERILE IRR 500ML POUR (IV SOLUTION) ×2 IMPLANT

## 2021-01-14 NOTE — Lactation Note (Signed)
This note was copied from a baby's chart. Lactation Consultation Note  Patient Name: Rebecca Gomez SEGBT'D Date: 01/14/2021 Reason for consult: L&D Initial assessment;Term;Other (Comment) (c/s, GDM) Age:30 hours  Initial lactation visit in LDR6. Mom 1.5hr post repeat c-section with GDM. Baby active at breast in cradle hold with wide open mouth and flanged top/bottom lips, showing rhythmic sucking pattern. Parents report she has been feeding since around 12:30pm. Mom independent in her position and latch.  Baby positioned well, turned in towards mom, space between chin and chest, ear/shoulder/hip all in straight line in cradle position.  LC reviewed BF basics: feeding on demand, early cues, newborn feeding patterns and behaviors, and output expectations for first 24 hours.  Glucose to be checked post conclusion of feeding; explained process to parents and outcome results will direct feeding path moving forward, parents verbalize understanding.  Maternal Data Has patient been taught Hand Expression?: Yes Does the patient have breastfeeding experience prior to this delivery?: Yes  Feeding Mother's Current Feeding Choice: Breast Milk  LATCH Score Latch: Grasps breast easily, tongue down, lips flanged, rhythmical sucking. (baby came off and mom re-latched)  Audible Swallowing: A few with stimulation  Type of Nipple: Everted at rest and after stimulation  Comfort (Breast/Nipple): Soft / non-tender  Hold (Positioning): No assistance needed to correctly position infant at breast.  LATCH Score: 9   Lactation Tools Discussed/Used    Interventions Interventions: Breast feeding basics reviewed;Hand express;Education;Support pillows  Discharge    Consult Status Consult Status: Follow-up Date: 01/14/21 Follow-up type: In-patient    [name] 01/14/2021, 1:37 PM

## 2021-01-14 NOTE — Interval H&P Note (Signed)
History and Physical Interval Note:  01/14/2021 10:59 AM  Rebecca Gomez  has presented today for surgery, with the diagnosis of history of c-section, desires repeat.  The various methods of treatment have been discussed with the patient and family. After consideration of risks, benefits and other options for treatment, the patient has consented to  Procedure(s): CESAREAN SECTION (N/A) as a surgical intervention.  The patient's history has been reviewed, patient examined, no change in status, stable for surgery.  I have reviewed the patient's chart and labs.  Questions were answered to the patient's satisfaction.  The patient states that she has had no contractions and doesn't believe she has labored. So, she would like to proceed with repeat c-section.   Thomasene Mohair, MD, [facility], [facility] 01/14/2021 11:00 AM

## 2021-01-14 NOTE — Anesthesia Preprocedure Evaluation (Addendum)
Anesthesia Evaluation  Patient identified by MRN, date of birth, ID band Patient awake  General Assessment Comment:Appropriate NPO, denies n/v.  Reviewed: Allergy & Precautions, NPO status , Patient's Chart, lab work & pertinent test results  History of Anesthesia Complications Negative for: history of anesthetic complications  Airway Mallampati: I  TM Distance: >3 FB Neck ROM: Full    Dental no notable dental hx. (+) Teeth Intact   Pulmonary neg pulmonary ROS, neg sleep apnea, neg COPD, Patient abstained from smoking.Not current smoker,    Pulmonary exam normal breath sounds clear to auscultation       Cardiovascular Exercise Tolerance: Good METS(-) hypertension(-) CAD and (-) Past MI negative cardio ROS  (-) dysrhythmias  Rhythm:Regular Rate:Normal - Systolic murmurs    Neuro/Psych negative neurological ROS  negative psych ROS   GI/Hepatic negative GI ROS, neg GERD  ,(+)     (-) substance abuse  ,   Endo/Other  diabetes, Gestational  Renal/GU negative Renal ROS     Musculoskeletal negative musculoskeletal ROS (+)   Abdominal (+) + obese,   Peds  Hematology  (+) anemia ,   Anesthesia Other Findings Obesity Gravid  Past Medical History: No date: Medical history non-contributory  Reproductive/Obstetrics (+) Pregnancy history of c-section, desires repeat                           Anesthesia Physical  Anesthesia Plan  ASA: 2  Anesthesia Plan: Spinal   Post-op Pain Management:    Induction:   PONV Risk Score and Plan: 2 and Ondansetron and Treatment may vary due to age or medical condition  Airway Management Planned: Natural Airway  Additional Equipment: None  Intra-op Plan:   Post-operative Plan: Extubation in OR  Informed Consent: I have reviewed the patients History and Physical, chart, labs and discussed the procedure including the risks, benefits and alternatives  for the proposed anesthesia with the patient or authorized representative who has indicated his/her understanding and acceptance.     Dental advisory given  Plan Discussed with: CRNA and Surgeon  Anesthesia Plan Comments:        Anesthesia Quick Evaluation

## 2021-01-14 NOTE — Anesthesia Procedure Notes (Signed)
Date/Time: 01/14/2021 11:11 AM Performed by: Joanette Gula, Jacoria Keiffer, CRNA Pre-anesthesia Checklist: Patient identified, Emergency Drugs available, Suction available, Patient being monitored and Timeout performed Patient Re-evaluated:Patient Re-evaluated prior to induction Oxygen Delivery Method: Nasal cannula Induction Type: IV induction

## 2021-01-14 NOTE — Transfer of Care (Signed)
Immediate Anesthesia Transfer of Care Note  Patient: Rebecca Gomez  Procedure(s) Performed: CESAREAN SECTION  Patient Location: Mother/Baby  Anesthesia Type:Spinal  Level of Consciousness: awake, alert  and oriented  Airway & Oxygen Therapy: Patient Spontanous Breathing  Post-op Assessment: Report given to RN and Post -op Vital signs reviewed and stable  Post vital signs: Reviewed and stable  Last Vitals:  Vitals Value Taken Time  BP 117/76   Temp    Pulse 78   Resp 15   SpO2 95     Last Pain:  Vitals:   01/14/21 0851  TempSrc:   PainSc: 0-No pain         Complications: No notable events documented.

## 2021-01-14 NOTE — Op Note (Signed)
Cesarean Section Operative Note    Patient Name: Rebecca Gomez  MRN: [id number]  Date of Surgery: 01/14/2021   Pre-operative Diagnosis:  1) History of cesarean delivery, desires repeat 2) intrauterine pregnancy at [redacted]w[redacted]d   Post-operative Diagnosis:  1) History of cesarean delivery, desires repeat 2) intrauterine pregnancy at [redacted]w[redacted]d    Procedure: Repeat Low Pfannenstiel incision with double layer uterine closure  Surgeon: Surgeon(s) and Role:    Conard Novak, MD - Primary  Assistants: Dr. Annamarie Major; No other capable assistant available, in surgery requiring high level assistant.  Anesthesia: spinal   Findings:  1) normal appearing gravid uterus, fallopian tubes, and ovaries 2) viable female infant with weight 3,210 grams, APGARs 8 and 9   Quantified Blood Loss: 315 mL  Total IV Fluids: 1,000 ml   Urine Output:  100 mL clear urine at end of procedure  Specimens: none  Complications: no complications  Disposition: PACU - hemodynamically stable.   Maternal Condition: stable   Baby condition / location:  Couplet care / Skin to Skin  Procedure Details:  The patient was seen in the Holding Room. The risks, benefits, complications, treatment options, and expected outcomes were discussed with the patient. The patient concurred with the proposed plan, giving informed consent. identified as Berle Mull and the procedure verified as C-Section Delivery. A Time Out was held and the above information confirmed.   After induction of anesthesia, the patient was draped and prepped in the usual sterile manner. A Pfannenstiel incision was made and carried down through the subcutaneous tissue to the fascia. Fascial incision was made and extended transversely. The fascia was separated from the underlying rectus tissue superiorly and inferiorly. The peritoneum was identified and entered. Peritoneal incision was extended longitudinally. The bladder flap was bluntly and sharply freed  from the lower uterine segment. A low transverse uterine incision was made and the hysterotomy was extended with cranial-caudal tension. Delivered from cephalic presentation was a 3,120 gram Living newborn infant(s) or Female with Apgar scores of 8 at one minute and 9 at five minutes. Cord ph was not sent the umbilical cord was clamped and cut cord blood was obtained for evaluation. The placenta was removed Intact and appeared normal. The uterine outline, tubes and ovaries appeared normal. The uterine incision was closed with running locked sutures of 0 Vicryl.  A second layer of the same suture was thrown in an imbricating fashion.  Hemostasis was assured.  The uterus was returned to the abdomen and the paracolic gutters were cleared of all clots and debris.  The rectus muscles were inspected and found to be hemostatic.  The On-Q catheter pumps were inserted in accordance with the manufacturer's recommendations.  The catheters were inserted approximately 4cm cephelad to the incision line, approximately 1cm apart, straddling the midline.  They were inserted to a depth of the 4th mark. They were positioned superficial to the rectus abdominus muscles and deep to the rectus fascia.    The fascia was then reapproximated with running sutures of 1-0 PDS, looped. The subcutaneous layer was closed with 3-0 vicryl in a running fashion to reduce tension on the skin closure.  The subcuticular closure was performed using 4-0 monocryl. The skin closure was reinforced using surgical skin glue.  The On-Q catheters were bolused with 5 mL of 0.5% marcaine plain for a total of 10 mL.  The catheters were affixed to the skin with surgical skin glue, steri-strips, and tegaderm.    The surgical  assistant performed tissue retraction, assistance with suturing, and fundal pressure.    Instrument, sponge, and needle counts were correct prior the abdominal closure and were correct at the conclusion of the case.  The patient received  Ancef 2 gram IV prior to skin incision (within 30 minutes). For VTE prophylaxis she was wearing SCDs throughout the case.  The assistant surgeon was an MD due to lack of availability of another Sales promotion account executive.   Signed: Conard Novak, MD 01/14/2021 12:09 PM

## 2021-01-14 NOTE — Discharge Summary (Signed)
Postpartum Discharge Summary    Patient Name: Rebecca Gomez DOB: 25-Apr-1991 MRN: [id number]  Date of admission: 01/14/2021 Delivery date:01/14/2021  Delivering provider: Prentice Docker D  Date of discharge: 01/16/2021  Admitting diagnosis: History of cesarean delivery [Z98.891] Intrauterine pregnancy: [redacted]w[redacted]d    Secondary diagnosis:  Principal Problem:   Supervision of high-risk pregnancy, third trimester Active Problems:   Postpartum care following cesarean delivery   History of cesarean delivery   Obesity affecting pregnancy   Gestational diabetes   [redacted] weeks gestation of pregnancy   Encounter for care or examination of lactating mother  Additional problems: mild range blood pressure    Discharge diagnosis: Term Pregnancy Delivered and Gestational Hypertension                                              Post partum procedures: none Augmentation: N/A Complications: None  Hospital course: Sceduled C/S   30y.o. yo [id number] admitted to the hospital 01/14/2021 for scheduled cesarean section with the following indication:Elective Repeat.Delivery details are as follows:  Membrane Rupture Time/Date: 11:28 AM ,01/14/2021   Delivery Method:C-Section, Low Transverse  Details of operation can be found in separate operative note.  Patient had an uncomplicated postpartum course.  She is ambulating, tolerating a regular diet, passing flatus, and urinating well. Patient is discharged home in stable condition on  01/16/21        Newborn Data: Birth date:01/14/2021  Birth time:11:29 AM  Gender:Female  Living status:Living  Apgars:8 ,9  Weight:3120 g     Magnesium Sulfate received: No BMZ received: No Rhophylac:N/A MMR:No T-DaP:Given prenatally received 11/11/2020 Flu: Yes received on 02/19/2020 Transfusion:No  Physical exam  Vitals:   01/15/21 1249 01/15/21 1641 01/16/21 0100 01/16/21 0815  BP: 140/85 126/84 133/86 (!) 136/94  Pulse: 67 61 (!) 59 60  Resp: _0 Temp: 97.7 F (36.5 C) 98.4 F (36.9 C) (!) 97.5 F (36.4 C) 97.8 F (36.6 C)  TempSrc: Oral Axillary Oral Axillary  SpO2: 99% 100% 98% 100%  Weight:      Height:       General: alert, cooperative, and no distress Lochia: appropriate Uterine Fundus: firm Incision: Healing well with no significant drainage, On Q intact DVT Evaluation: No evidence of DVT seen on physical exam. Labs: Lab Results  Component Value Date   WBC 7.5 01/15/2021   HGB 9.7 (L) 01/15/2021   HCT 30.2 (L) 01/15/2021   MCV 81.4 01/15/2021   PLT 249 01/15/2021   CMP Latest Ref Rng & Units 03/12/2020  Glucose 70 - 99 mg/dL 98  BUN 6 - 20 mg/dL 11  Creatinine 0.44 - 1.00 mg/dL 0.58  Sodium 135 - 145 mmol/L 137  Potassium 3.5 - 5.1 mmol/L 3.7  Chloride 98 - 111 mmol/L 106  CO2 22 - 32 mmol/L 23  Calcium 8.9 - 10.3 mg/dL 8.9  Total Protein 6.5 - 8.1 g/dL 7.4  Total Bilirubin 0.3 - 1.2 mg/dL 0.4  Alkaline Phos 38 - 126 U/L 62  AST 15 - 41 U/L 19  ALT 0 - 44 U/L 19   Edinburgh Score: Edinburgh Postnatal Depression Scale Screening Tool 01/15/2021  I have been able to laugh and see the funny side of things. 0  I have looked forward with enjoyment to things. 0  I have blamed  myself unnecessarily when things went wrong. 1  I have been anxious or worried for no good reason. 2  I have felt scared or panicky for no good reason. 1  Things have been getting on top of me. 0  I have been so unhappy that I have had difficulty sleeping. 0  I have felt sad or miserable. 0  I have been so unhappy that I have been crying. 0  The thought of harming myself has occurred to me. 0  Edinburgh Postnatal Depression Scale Total 4      After visit meds:  Allergies as of 01/16/2021   No Known Allergies      Medication List     STOP taking these medications    calcium carbonate 750 MG chewable tablet Commonly known as: TUMS EX   ondansetron 4 MG disintegrating tablet Commonly known as: ZOFRAN-ODT       TAKE  these medications    acetaminophen 500 MG tablet Commonly known as: TYLENOL Take 1,000 mg by mouth every 8 (eight) hours as needed for moderate pain.   labetalol 200 MG tablet Commonly known as: NORMODYNE Take 1 tablet (200 mg total) by mouth 2 (two) times daily.   multivitamin-prenatal 27-0.8 MG Tabs tablet Take 1 tablet by mouth daily at 12 noon.   oxyCODONE 5 MG immediate release tablet Commonly known as: Roxicodone Take 1 tablet (5 mg total) by mouth every 6 (six) hours as needed for up to 5 days for severe pain.               Discharge Care Instructions  (From admission, onward)           Start     Ordered   01/16/21 0000  Discharge wound care:       Comments: Keep incision dry, clean.   01/16/21 1022             Discharge home in stable condition Infant Feeding: Breast Infant Disposition:home with mother Discharge instruction: per After Visit Summary and Postpartum booklet. Activity: Advance as tolerated. Pelvic rest for 6 weeks.  Diet: routine diet Anticipated Birth Control: Unsure discuss at 1 week visit Postpartum Appointment:6 weeks Additional Postpartum F/U: Incision check 1 week Future Appointments: Future Appointments  Date Time Provider [place]  01/22/2021 11:10 AM Will Bonnet, MD [facility] None   Follow up Visit:  Follow-up Information     Will Bonnet, MD. Go on 01/22/2021.   Specialty: Obstetrics and Gynecology Why: For incision check Contact information: [address] [place] [address] [phone]                 SIGNED: Rod Can, CNM

## 2021-01-15 LAB — CBC
HCT: 30.2 % — ABNORMAL LOW (ref 36.0–46.0)
Hemoglobin: 9.7 g/dL — ABNORMAL LOW (ref 12.0–15.0)
MCH: 26.1 pg (ref 26.0–34.0)
MCHC: 32.1 g/dL (ref 30.0–36.0)
MCV: 81.4 fL (ref 80.0–100.0)
Platelets: 249 10*3/uL (ref 150–400)
RBC: 3.71 MIL/uL — ABNORMAL LOW (ref 3.87–5.11)
RDW: 15.7 % — ABNORMAL HIGH (ref 11.5–15.5)
WBC: 7.5 10*3/uL (ref 4.0–10.5)
nRBC: 0 % (ref 0.0–0.2)

## 2021-01-15 LAB — GLUCOSE, CAPILLARY
Glucose-Capillary: 93 mg/dL (ref 70–99)
Glucose-Capillary: 110 mg/dL — ABNORMAL HIGH (ref 70–99)

## 2021-01-15 LAB — RPR: RPR Ser Ql: NONREACTIVE

## 2021-01-15 NOTE — Progress Notes (Signed)
Subjective: Postpartum Day 1: Cesarean Delivery Patient reports tolerating PO, + flatus, and no problems voiding.    Objective: Vital signs in last 24 hours: Temp:  [97.9 F (36.6 C)-98.5 F (36.9 C)] 98.4 F (36.9 C) (09/02 0408) Pulse Rate:  [54-94] 65 (09/02 0408) Resp:  [10-32] 18 (09/02 0901) BP: (107-135)/(65-89) 109/69 (09/02 0408) SpO2:  [87 %-100 %] 98 % (09/02 0408)  Physical Exam:  General: alert, cooperative, no distress, and moderately obese Lochia: appropriate Uterine Fundus: firm Incision: healing well, no significant drainage DVT Evaluation: No evidence of DVT seen on physical exam. Negative Homan's sign.  Recent Labs    01/14/21 0911 01/15/21 0731  HGB 11.6* 9.7*  HCT 35.6* 30.2*    Assessment/Plan: Status post Cesarean section. Doing well postoperatively.  Continue current care.  Mirna Mires 01/15/2021, 10:04 AM

## 2021-01-15 NOTE — Lactation Note (Signed)
This note was copied from a baby's chart. Lactation Consultation Note  Patient Name: Rebecca Gomez [id number] Date: 01/15/2021 Reason for consult: Follow-up assessment;Term Age:30 hours  Maternal Data Does the patient have breastfeeding experience prior to this delivery?: Yes How long did the patient breastfeed?: ?3 mths, unsure how long  Feeding Mother's Current Feeding Choice: Breast Milk BAby fussy and rooting, mom attempted latch, on right breast, baby would suck a few times, then come off and cry and would then be unable to coordinate latch, baby placed skin to skin on mom's chest to calm and then try again, mom states baby does this when nurses are observing, but does well when alone.   LATCH Score Latch: Repeated attempts needed to sustain latch, nipple held in mouth throughout feeding, stimulation needed to elicit sucking reflex. (fussy, can't coordinate, few sucks)                  Lactation Tools Discussed/Used LC name and no written on white board   Interventions Interventions: Assisted with latch;Skin to skin;Adjust position;Coconut oil;Education  Discharge Pump: Personal WIC Program: No  Consult Status Consult Status: PRN Date: 01/15/21 Follow-up type: In-patient    Dyann Kief 01/15/2021, 12:52 PM

## 2021-01-15 NOTE — Lactation Note (Signed)
This note was copied from a baby's chart. Lactation Consultation Note  Patient Name: Rebecca Gomez [id number] Date: 01/15/2021 Reason for consult: Mother's request Age:30 hours  Maternal Data    Feeding Mother's Current Feeding Choice: Breast Milk Mom nursing baby in football hold on left, states this position has been more effective, swallows noted, mom encouraged to offer breast at least 8 times in 24 hrs, especially since mom is GDM, to maintain baby's blood sugars. LATCH Score Latch: Grasps breast easily, tongue down, lips flanged, rhythmical sucking.  Audible Swallowing: Spontaneous and intermittent  Type of Nipple: Everted at rest and after stimulation  Comfort (Breast/Nipple): Soft / non-tender  Hold (Positioning): No assistance needed to correctly position infant at breast.  LATCH Score: 10   Lactation Tools Discussed/Used Breastfeeding resource sheet given  Interventions    Discharge    Consult Status Consult Status: PRN Date: 01/16/21 Follow-up type: In-patient    Dyann Kief 01/15/2021, 5:06 PM

## 2021-01-15 NOTE — Anesthesia Postprocedure Evaluation (Signed)
Anesthesia Post Note  Patient: Rebecca Gomez  Procedure(s) Performed: CESAREAN SECTION  Patient location during evaluation: Mother Baby Anesthesia Type: Spinal Level of consciousness: awake and alert and oriented Pain management: pain level controlled Vital Signs Assessment: post-procedure vital signs reviewed and stable Respiratory status: respiratory function stable Cardiovascular status: stable Postop Assessment: no headache, no backache, adequate PO intake, able to ambulate and no apparent nausea or vomiting Anesthetic complications: no   No notable events documented.   Last Vitals:  Vitals:   01/15/21 0900 01/15/21 0901  BP:    Pulse: 62   Resp:  18  Temp:    SpO2: 100%     Last Pain:  Vitals:   01/15/21 0901  TempSrc:   PainSc: 0-No pain                 Zachary George

## 2021-01-16 LAB — PROTEIN / CREATININE RATIO, URINE
Creatinine, Urine: 30 mg/dL
Protein Creatinine Ratio: 0.23 mg/mg{Cre} — ABNORMAL HIGH (ref 0.00–0.15)
Total Protein, Urine: 7 mg/dL

## 2021-01-16 MED ORDER — OXYCODONE HCL 5 MG PO TABS: 5.0000 mg | ORAL_TABLET | Freq: Four times a day (QID) | ORAL | 0 refills | Status: AC | PRN

## 2021-01-16 MED ORDER — LABETALOL HCL 200 MG PO TABS: 200.0000 mg | ORAL_TABLET | Freq: Two times a day (BID) | ORAL | 1 refills | Status: DC

## 2021-01-16 NOTE — Progress Notes (Signed)
Reviewed discharge teaching and follow up appointments. Instructed pt wound dressing care and how to remove as well as removal of on-Q pump. Instructed to start Labetalol this evening and continue BID reviewed Signs and symptoms of hypotension. Patient verbalized understanding.

## 2021-01-20 ENCOUNTER — Telehealth: Payer: Self-pay

## 2021-01-20 NOTE — Telephone Encounter (Signed)
Pt calling; had c/s last Thurs c SDJ; the dressing on the d/s- should she take it off?  It has an air bubble underneath it; doesn't remember all that was said at d/c.  [phone]  Adv pt to keep dressing clean and dry until appt on the 6th; states she took out On-Q pump yesterday and doing well.  Pt states dressing is not bothering her and is stuck well to skin.

## 2021-01-22 ENCOUNTER — Encounter: Payer: Self-pay | Admitting: Obstetrics and Gynecology

## 2021-01-22 ENCOUNTER — Ambulatory Visit (INDEPENDENT_AMBULATORY_CARE_PROVIDER_SITE_OTHER): Payer: BC Managed Care – PPO | Admitting: Obstetrics and Gynecology

## 2021-01-22 ENCOUNTER — Other Ambulatory Visit: Payer: Self-pay

## 2021-01-22 DIAGNOSIS — Z09 Encounter for follow-up examination after completed treatment for conditions other than malignant neoplasm: Secondary | ICD-10-CM

## 2021-01-22 DIAGNOSIS — O24410 Gestational diabetes mellitus in pregnancy, diet controlled: Secondary | ICD-10-CM

## 2021-01-22 NOTE — Progress Notes (Signed)
   Postoperative Follow-up Patient presents post op from cesarean section  8 days  ago.  Subjective: She denies fever, chills, nausea, and vomiting. Eating a regular diet without difficulty. The patient is not having any pain.  Activity: increasing slowly. She denies issues with her incision.    Objective: BP 122/74   Wt 188 lb (85.3 kg)   LMP 04/13/2020   BMI 33.30 kg/m   Physical Exam Constitutional:      General: She is not in acute distress.    Appearance: Normal appearance.  HENT:     Head: Normocephalic and atraumatic.  Eyes:     General: No scleral icterus.    Conjunctiva/sclera: Conjunctivae normal.  Abdominal:     General: There is no distension.     Palpations: Abdomen is soft. There is mass (uterus at U-3).     Tenderness: There is no abdominal tenderness. There is no guarding or rebound.     Comments: Incision: without erythema, induration, warmth, and tenderness. It is clean, dry, and intact.     Neurological:     General: No focal deficit present.     Mental Status: She is alert and oriented to person, place, and time.     Cranial Nerves: No cranial nerve deficit.  Psychiatric:        Mood and Affect: Mood normal.        Behavior: Behavior normal.        Judgment: Judgment normal.   EPDS: 7 Assessment: 30 y.o. s/p cesarean section progressing well  Plan: Patient has done well after surgery with no apparent complications.  I have discussed the post-operative course to date, and the expected progress moving forward.  The patient understands what complications to be concerned about.    Activity plan: increase slowly  Return in about 5 weeks (around 02/26/2021) for Six Week Postpartum w 2 hour gtt lab.  Thomasene Mohair, MD 01/22/2021 12:19 PM

## 2021-02-07 ENCOUNTER — Other Ambulatory Visit: Payer: Self-pay | Admitting: Advanced Practice Midwife

## 2021-02-22 ENCOUNTER — Other Ambulatory Visit: Payer: Self-pay | Admitting: Advanced Practice Midwife

## 2021-02-25 ENCOUNTER — Telehealth: Payer: Self-pay

## 2021-02-25 NOTE — Telephone Encounter (Signed)
Pt calling for status of breast pump order with her ins; she was just notified that the order is about to be suspended because of no response from [place].  [phone]

## 2021-02-26 NOTE — Telephone Encounter (Signed)
I have looked through all my breast pump forms for SDJ to sign, and I do not have this order. Called pt, no answer.

## 2021-03-01 ENCOUNTER — Ambulatory Visit (INDEPENDENT_AMBULATORY_CARE_PROVIDER_SITE_OTHER): Payer: BC Managed Care – PPO | Admitting: Obstetrics and Gynecology

## 2021-03-01 ENCOUNTER — Other Ambulatory Visit: Payer: Self-pay

## 2021-03-01 ENCOUNTER — Encounter: Payer: Self-pay | Admitting: Obstetrics and Gynecology

## 2021-03-01 ENCOUNTER — Other Ambulatory Visit: Payer: BC Managed Care – PPO

## 2021-03-01 DIAGNOSIS — O24410 Gestational diabetes mellitus in pregnancy, diet controlled: Secondary | ICD-10-CM

## 2021-03-01 MED ORDER — BREAST PUMP MISC: 0 refills | Status: AC

## 2021-03-01 NOTE — Progress Notes (Signed)
Postpartum Visit   Chief Complaint  Patient presents with   Postpartum Care   History of Present Illness: Patient is a 30 y.o. [id number] presents for postpartum visit.  Date of delivery: 01/14/2021 Type of delivery: C-Section Episiotomy No.  Laceration: no  Pregnancy or labor problems:  yes-GDM Any problems since the delivery:  no  Newborn Details:  SINGLETON :  1. Baby's name: Briella. Birth weight: 6.14lb Maternal Details:  Breast Feeding:  yes Post partum depression/anxiety noted:  no Edinburgh Post-Partum Depression Score:  6  Date of last PAP: 08/22/2019  normal   Past Medical History:  Diagnosis Date   Diabetes mellitus without complication (HCC)    GESTATIONAL DIABETIC   Medical history non-contributory    Miscarriage     Past Surgical History:  Procedure Laterality Date   CESAREAN SECTION N/A 02/20/2017   Procedure: CESAREAN SECTION;  Surgeon: Nadara Mustard, MD;  Location: [facility];  Service: Obstetrics;  Laterality: N/A;  Time of Birth: 11:45 Sex: Female Wt:    CESAREAN SECTION N/A 01/14/2021   Procedure: CESAREAN SECTION;  Surgeon: Conard Novak, MD;  Location: [facility];  Service: Obstetrics;  Laterality: N/A;   DILATION AND EVACUATION N/A 03/12/2020   Procedure: DILATATION AND EVACUATION;  Surgeon: Natale Milch, MD;  Location: [facility];  Service: Gynecology;  Laterality: N/A;   NO PAST SURGERIES      Prior to Admission medications   Medication Sig Start Date End Date Taking? Authorizing Provider  acetaminophen (TYLENOL) 500 MG tablet Take 1,000 mg by mouth every 8 (eight) hours as needed for moderate pain.    [provider]  labetalol (NORMODYNE) 200 MG tablet TAKE 1 TABLET BY MOUTH TWICE A DAY 02/08/21   Tresea Mall, CNM  Prenatal Vit-Fe Fumarate-FA (MULTIVITAMIN-PRENATAL) 27-0.8 MG TABS tablet Take 1 tablet by mouth daily at 12 noon.    [provider]    No Known Allergies   Social History   Socioeconomic History    Marital status: Married    Spouse name: Lloyd Huger   Number of children: Not on file   Years of education: Not on file   Highest education level: Not on file  Occupational History   Not on file  Tobacco Use   Smoking status: Never   Smokeless tobacco: Never  Vaping Use   Vaping Use: Never used  Substance and Sexual Activity   Alcohol use: No   Drug use: No   Sexual activity: Yes    Birth control/protection: None    Comment: Undecided  Other Topics Concern   Not on file  Social History Narrative   Not on file   Social Determinants of Health   Financial Resource Strain: Not on file  Food Insecurity: Not on file  Transportation Needs: Not on file  Physical Activity: Not on file  Stress: Not on file  Social Connections: Not on file  Intimate Partner Violence: Not on file    Family History  Problem Relation Age of Onset   Diabetes Mother    Hypertension Mother     Review of Systems  Constitutional: Negative.   HENT: Negative.    Eyes: Negative.   Respiratory: Negative.    Cardiovascular: Negative.   Gastrointestinal: Negative.   Genitourinary: Negative.   Musculoskeletal: Negative.   Skin: Negative.   Neurological: Negative.   Psychiatric/Behavioral: Negative.      Physical Exam BP 122/74   Ht 5\' 3"  (1.6 m)   Wt 181 lb (82.1 kg)  LMP 04/13/2020   Breastfeeding Yes   BMI 32.06 kg/m   Physical Exam Constitutional:      General: She is not in acute distress.    Appearance: Normal appearance.  HENT:     Head: Normocephalic and atraumatic.  Eyes:     General: No scleral icterus.    Conjunctiva/sclera: Conjunctivae normal.  Abdominal:     General: Bowel sounds are normal. There is no distension.     Palpations: Abdomen is soft. There is no mass.     Tenderness: There is no abdominal tenderness. There is no guarding or rebound.     Hernia: No hernia is present.     Comments: Incision: without erythema, induration, warmth, and tenderness. It is clean, dry, and  intact.   Neurological:     General: No focal deficit present.     Mental Status: She is alert and oriented to person, place, and time.     Cranial Nerves: No cranial nerve deficit.  Psychiatric:        Mood and Affect: Mood normal.        Behavior: Behavior normal.        Judgment: Judgment normal.     Female Chaperone present during breast and/or pelvic exam.  Assessment: 30 y.o. [id number] presenting for 6 week postpartum visit  Plan: Problem List Items Addressed This Visit   None Visit Diagnoses     Postpartum care and examination    -  Primary   Postpartum care and examination of lactating mother       Relevant Medications   Misc. Devices (BREAST PUMP) MISC        1) Contraception: previously discussed at length. She elects no hormonal contraception now. Will use barrier method for the time being.Marland Kitchen  2)  Pap - ASCCP guidelines and rational discussed.  Patient opts for routine screening interval. Due in 1.5 years  3) Patient underwent screening for postpartum depression with no concerns noted.  4) Follow up 1 year for routine annual exam  Thomasene Mohair, MD 03/01/2021 9:26 AM

## 2021-03-02 LAB — GLUCOSE TOLERANCE, 2 HOURS W/ 1HR
Glucose, 1 hour: 108 mg/dL (ref 70–179)
Glucose, 2 hour: 123 mg/dL (ref 70–152)
Glucose, Fasting: 81 mg/dL (ref 70–91)

## 2021-04-27 DIAGNOSIS — Z20822 Contact with and (suspected) exposure to covid-19: Secondary | ICD-10-CM | POA: Diagnosis not present

## 2021-04-27 DIAGNOSIS — J029 Acute pharyngitis, unspecified: Secondary | ICD-10-CM | POA: Diagnosis not present

## 2021-07-14 ENCOUNTER — Other Ambulatory Visit: Payer: Self-pay

## 2021-10-13 DIAGNOSIS — O9123 Nonpurulent mastitis associated with lactation: Secondary | ICD-10-CM | POA: Diagnosis not present

## 2022-08-26 IMAGING — US US OB < 14 WEEKS - US OB TV
1 series · 14 of 28 positions shown · non-contrast
Comparison: None of this gestation

CLINICAL DATA: Twelve weeks pregnant with vaginal bleeding and
passed products of conception.

EXAM:
OBSTETRIC <14 WK US AND TRANSVAGINAL OB US
TECHNIQUE: Both transabdominal and transvaginal ultrasound examinations were
performed for complete evaluation of the gestation as well as the
maternal uterus, adnexal regions, and pelvic cul-de-sac.
Transvaginal technique was performed to assess early pregnancy.

[Series 1: us ob less than 14 weeks with ob transvaginal · 14 of 100 slices shown]
[im 4/100]
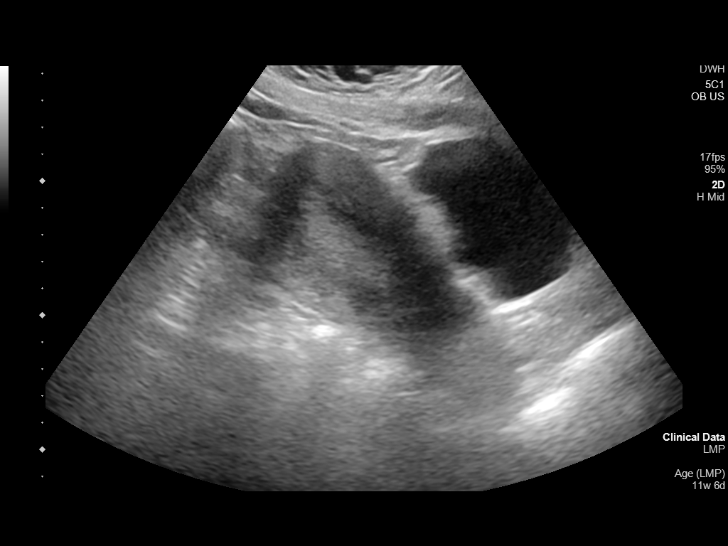
[im 12/100]
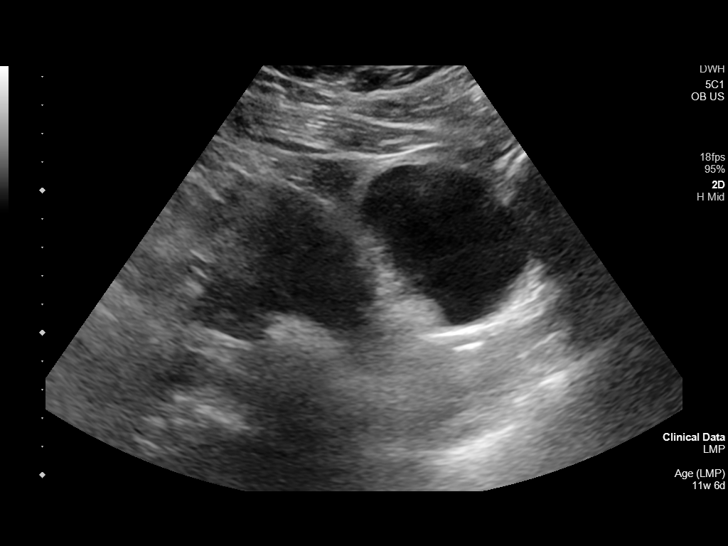
[im 19/100]
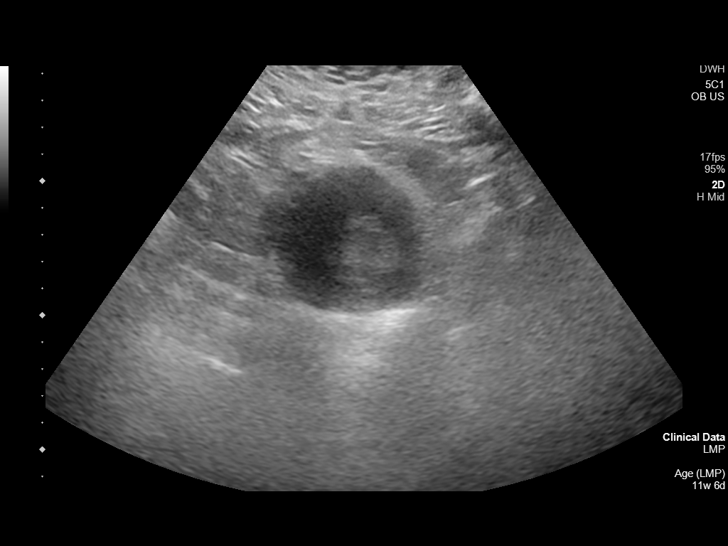
[im 26/100]
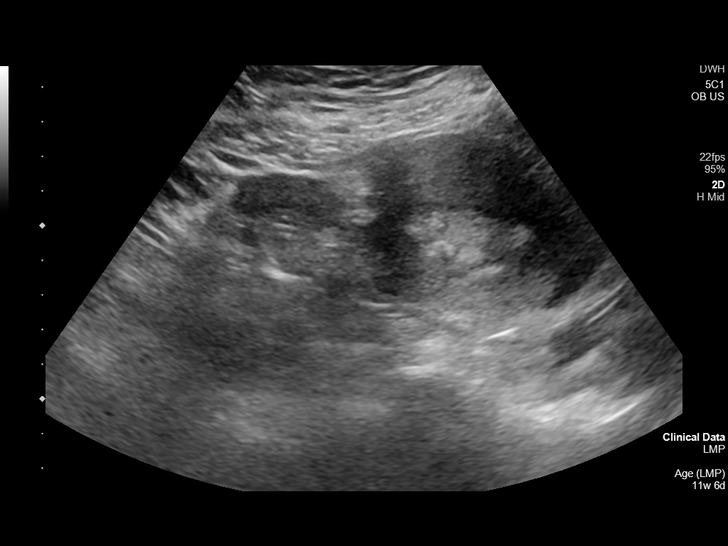
[im 34/100]
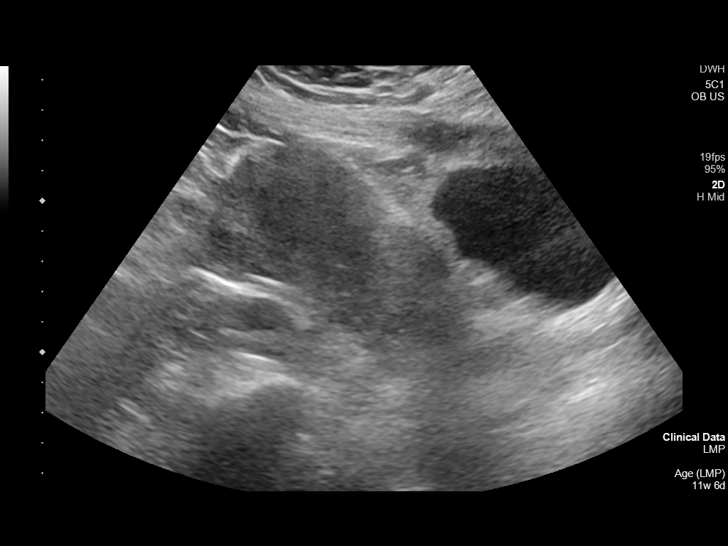
[im 41/100]
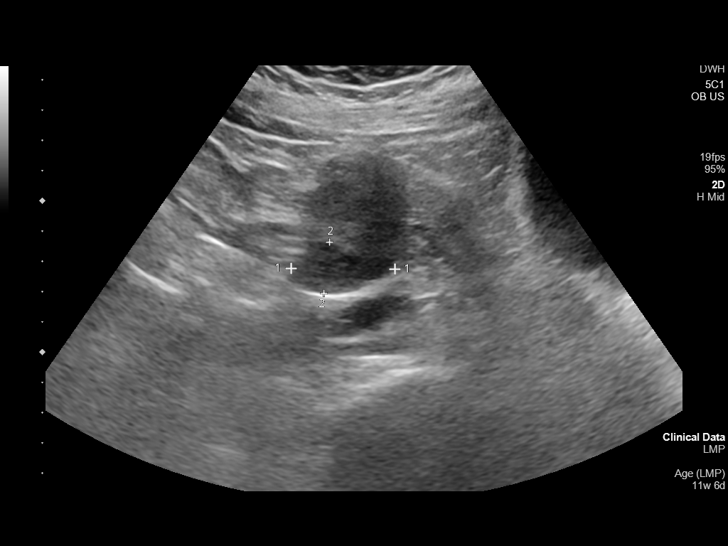
[im 48/100]
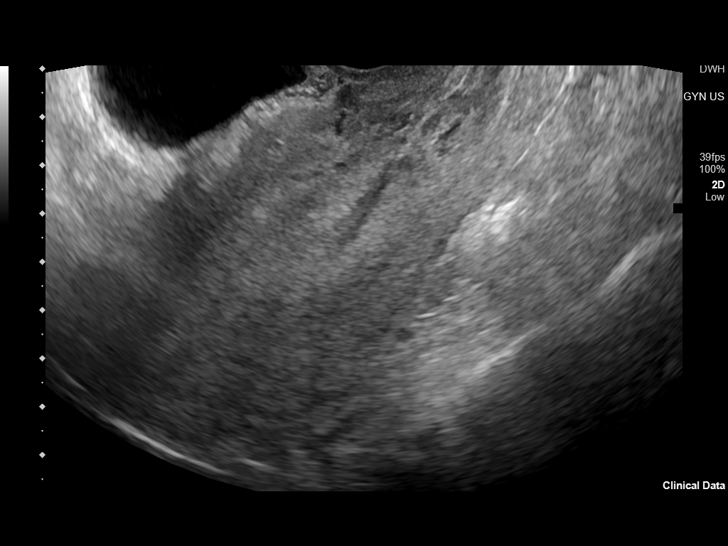
[im 56/100]
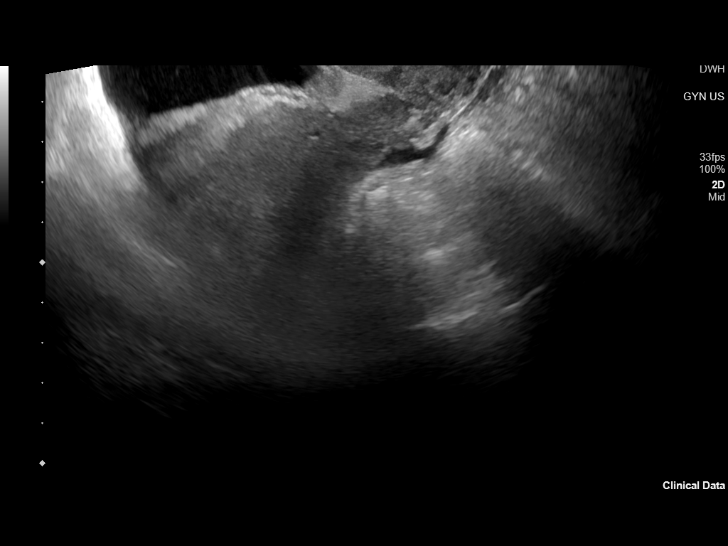
[im 63/100]
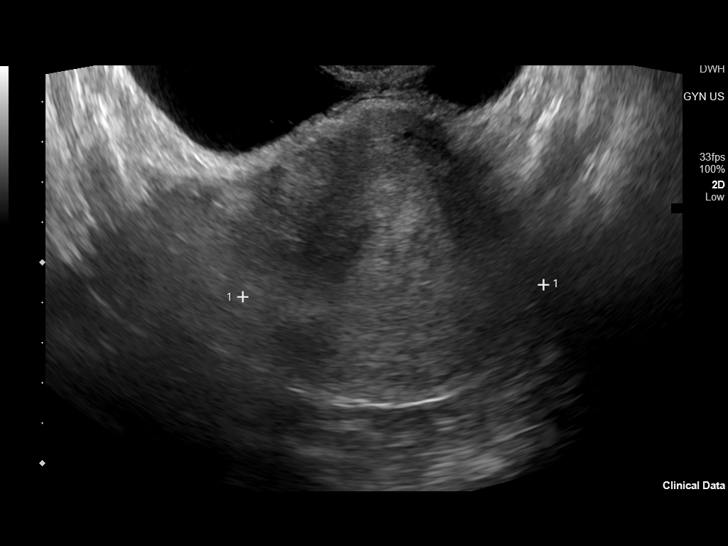
[im 70/100]
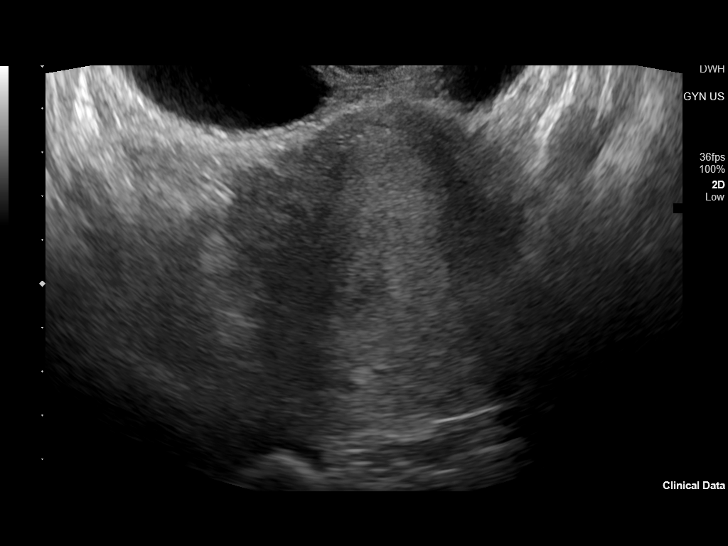
[im 78/100]
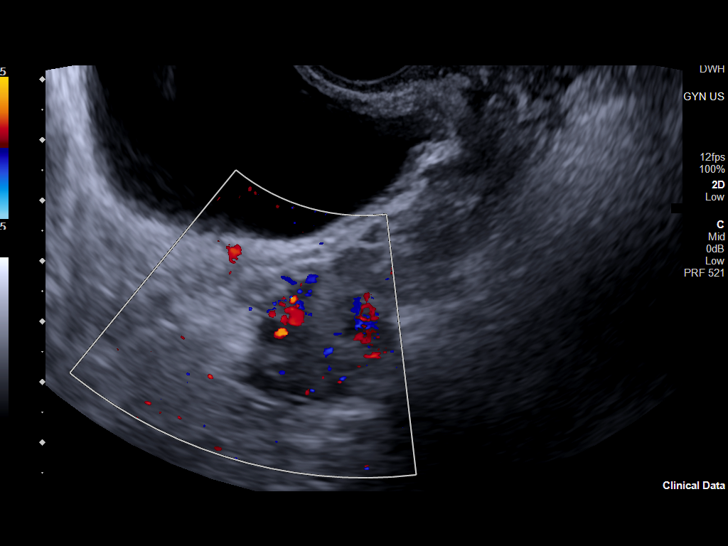
[im 85/100]
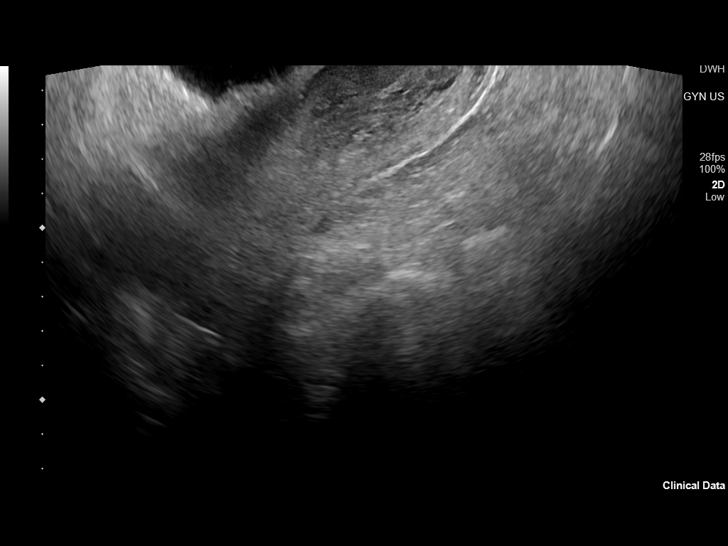
[im 92/100]
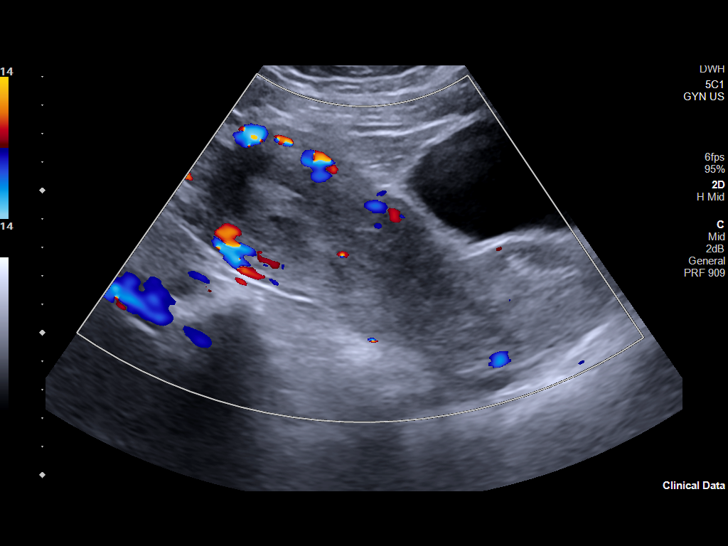
[im 100/100]
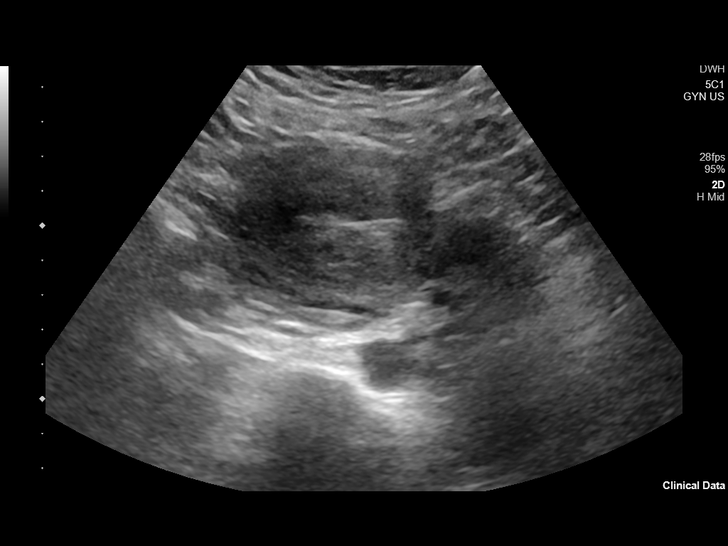

[14 of 28 positions shown; findings below may reference images not displayed]

FINDINGS: No visible intra or extra uterine gestational sac. Endometrial
thickness is 18 mm with some heterogeneity of endometrial contents
especially on transverse transvaginal images. On color Doppler
images most of the signal appears localized to the myometrium,
especially on the transvaginal images which have the best
resolution.

Symmetric normal appearance of the ovaries. No adnexal mass or
pelvic fluid.
IMPRESSION: 1. No visible intra or extrauterine gestation, reportedly there has
been preceding spontaneous abortion.
2. 18 mm endometrial thickness.

## 2023-02-07 IMAGING — US US OB COMP +14 WK
1 of 2 series · 13 of 28 positions shown · non-contrast
Comparison: none

CLINICAL DATA: Pregnancy.  Assess fetal anatomy.

EXAM:
OBSTETRICAL ULTRASOUND >14 WKS

[Series 1: us ob comp +14 wk · 0.25mm/px · 13 of 104 slices shown]
[im 4/104]
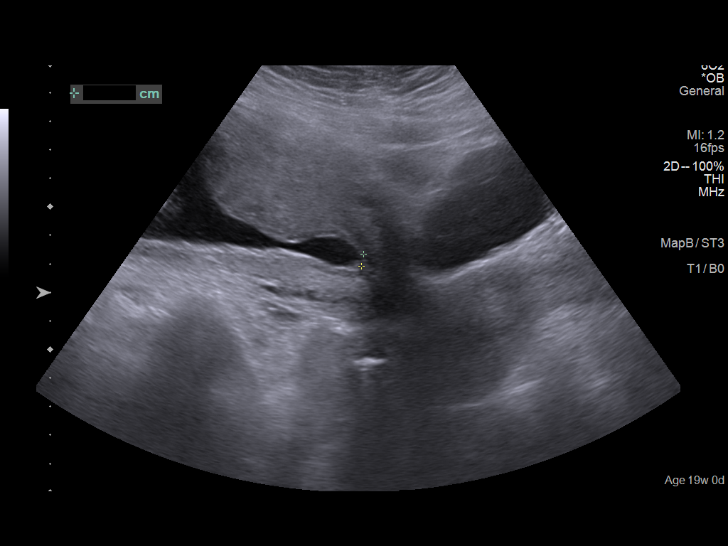
[im 12/104]
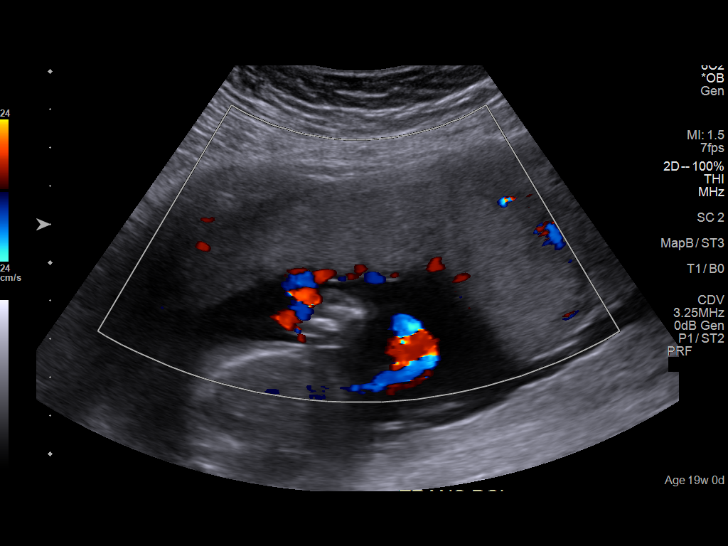
[im 20/104]
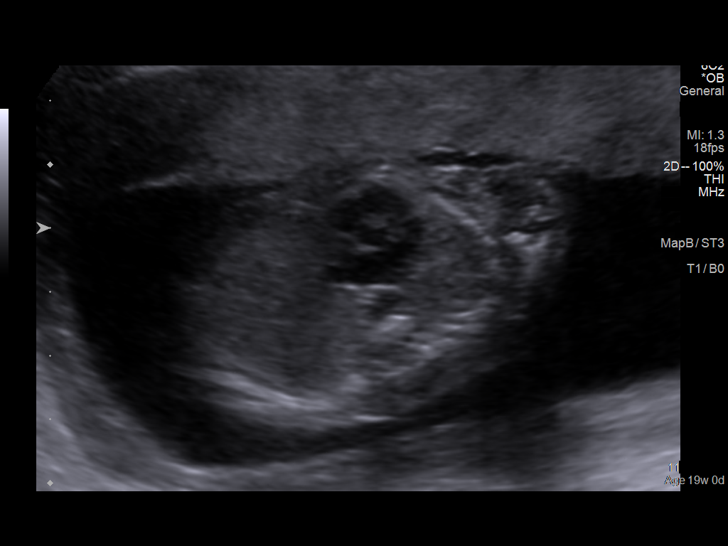
[im 28/104]
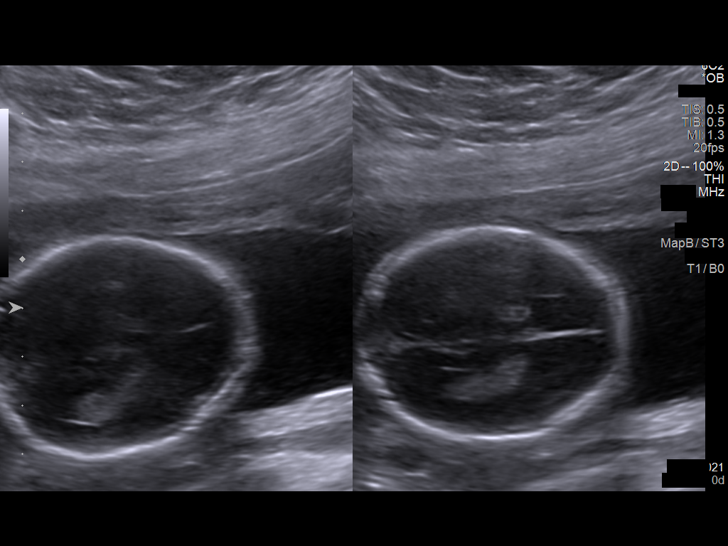
[im 36/104]
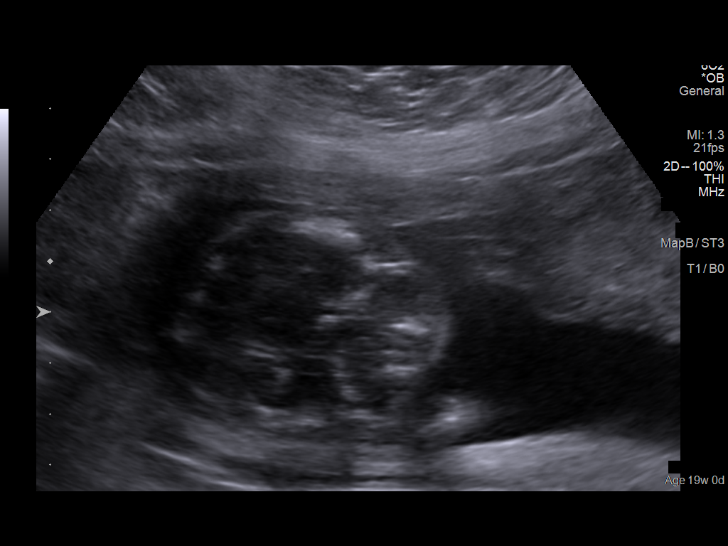
[im 44/104]
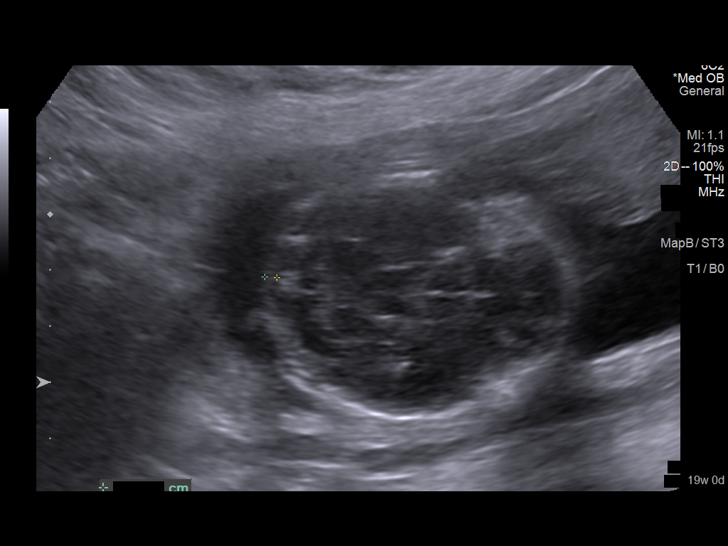
[im 56/104]
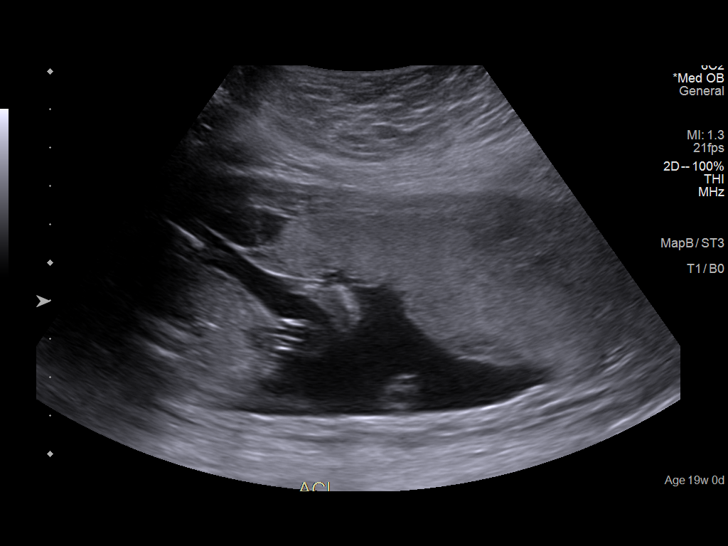
[im 64/104]
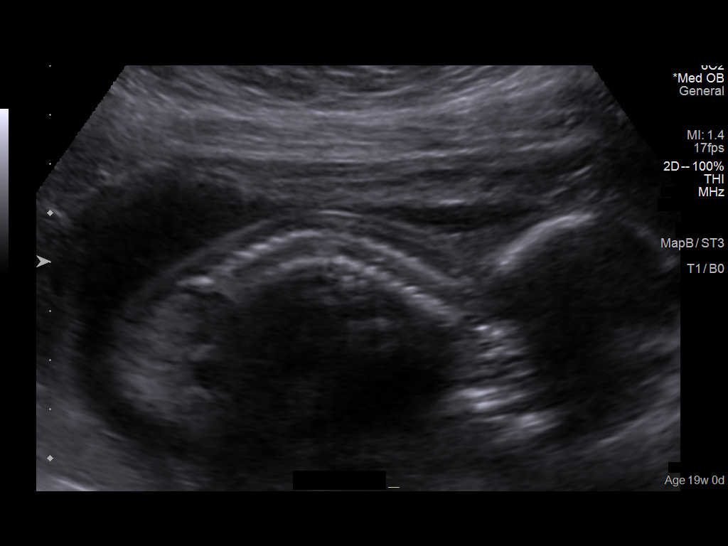
[im 72/104]
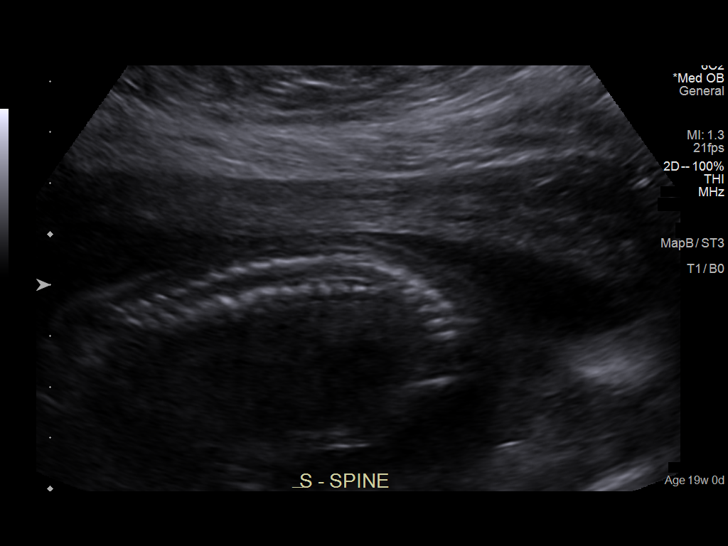
[im 80/104]
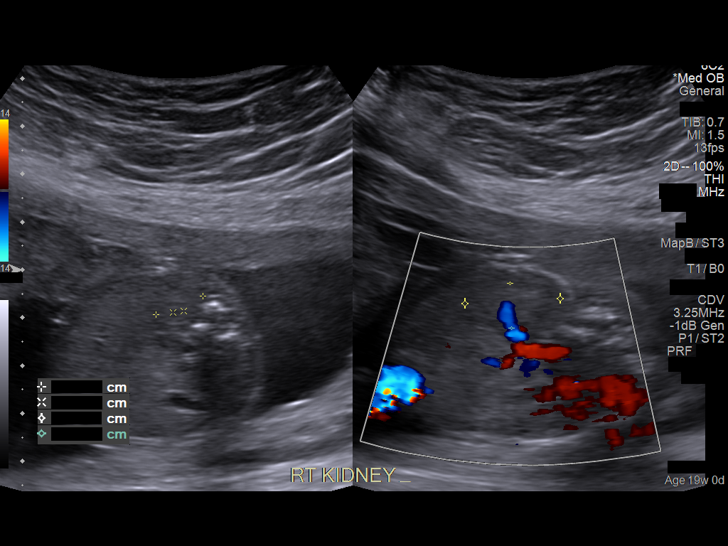
[im 88/104]
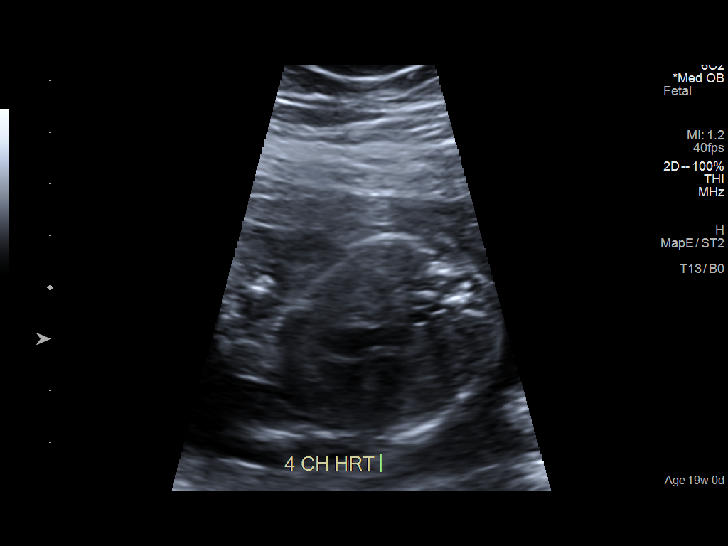
[im 96/104]
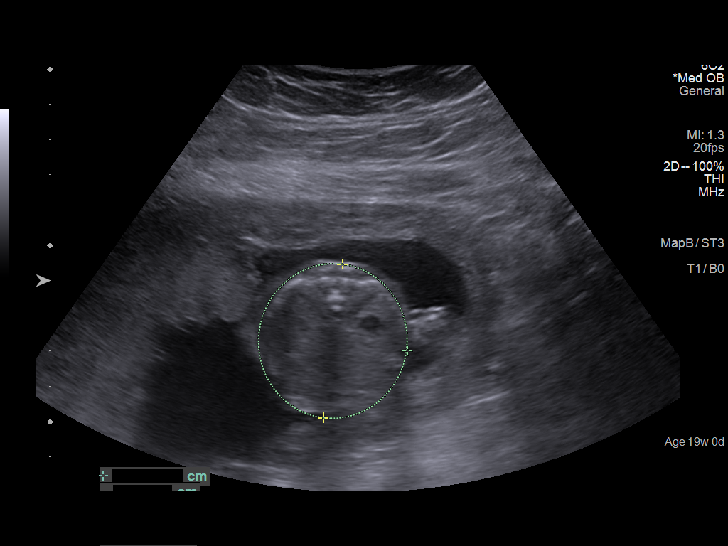
[im 104/104]
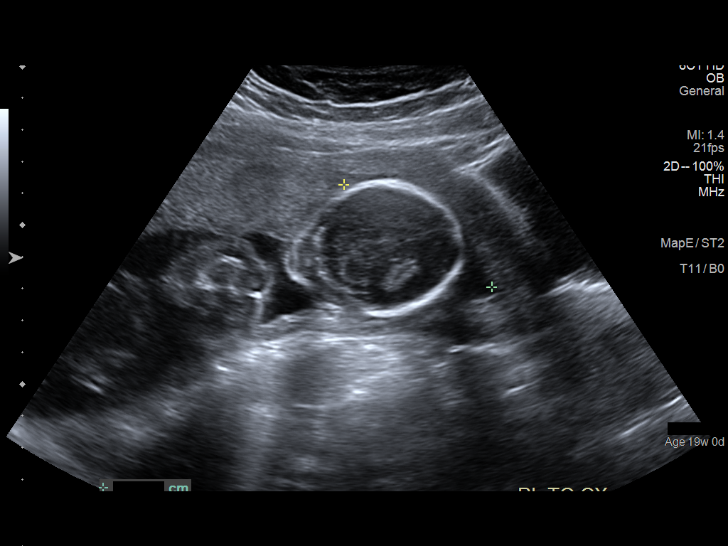

[13 of 28 positions shown; findings below may reference images not displayed]

FINDINGS: Number of Fetuses: 1

Heart Rate:  152 bpm

Movement: Yes

Presentation: Transverse, head maternal left

Previa: No

Placental Location: Anterior

Amniotic Fluid (Subjective): Normal

Amniotic Fluid (Objective):

Vertical pocket = 4.5cm

FETAL BIOMETRY

BPD: 4.4cm 19w 3d

HC:   16.6cm 19w 2d

AC:   13.8cm 19w 1d

FL:   3.2cm 19w 6d

Current Mean GA: 19w 3d US EDC: 01/15/2021

Assigned GA:  19w 0d Assigned EDC: 01/18/2021

Estimated Fetal Weight:  294g 73%ile

FETAL ANATOMY

Lateral Ventricles: Appears normal

Thalami/CSP: Appears normal

Posterior Fossa:  Appears normal

Nuchal Region: Appears normal   NFT= 2 mm

Upper Lip: Appears normal

Spine: Appears normal

4 Chamber Heart on Left: Appears normal

LVOT: Not visualized

RVOT: Not visualized

Stomach on Left: Appears normal

3 Vessel Cord: Appears normal

Cord Insertion site: Appears normal

Kidneys: Appears normal

Bladder: Appears normal

Extremities: Appears normal

Technically difficult due to: Fetal position

Maternal Findings:

Cervix:  Closed.  5.0 cm.
IMPRESSION: 1. Single live intrauterine gestation in transverse presentation.
2. Assigned gestational age of 19 weeks 0 days. Adequate interval
growth.
3. Limited evaluation of the fetal cardiac ventricular outflow
tracks. Attention on follow-up. Remainder of the fetal anatomic
survey is unremarkable without evidence of fetal anomaly.

## 2023-04-14 IMAGING — US US OB FOLLOW-UP
1 series · 13 of 28 positions shown · non-contrast
Comparison: none

CLINICAL DATA: Pregnancy.  Follow-up anatomy.

EXAM:
OBSTETRIC 14+ WK ULTRASOUND FOLLOW-UP

[Series 1: ob us · 13 of 54 slices shown]
[im 2/54]
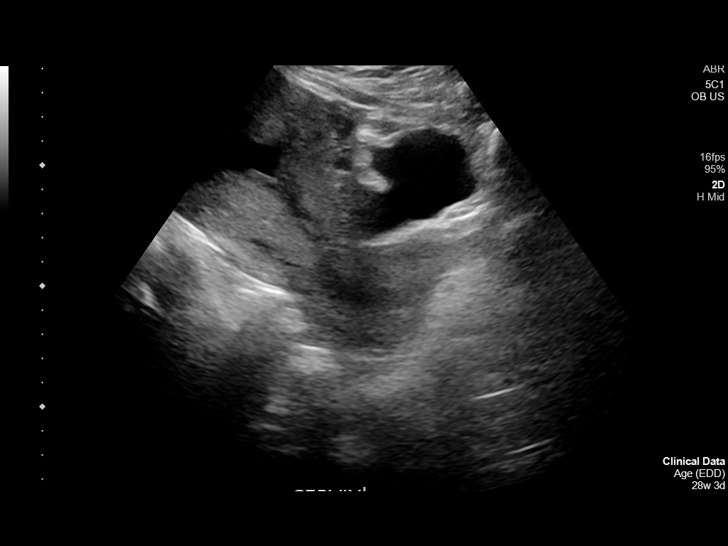
[im 6/54]
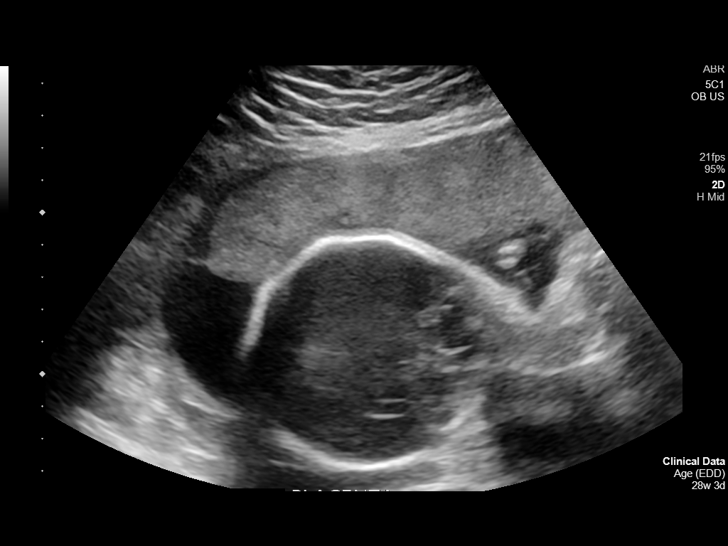
[im 10/54]
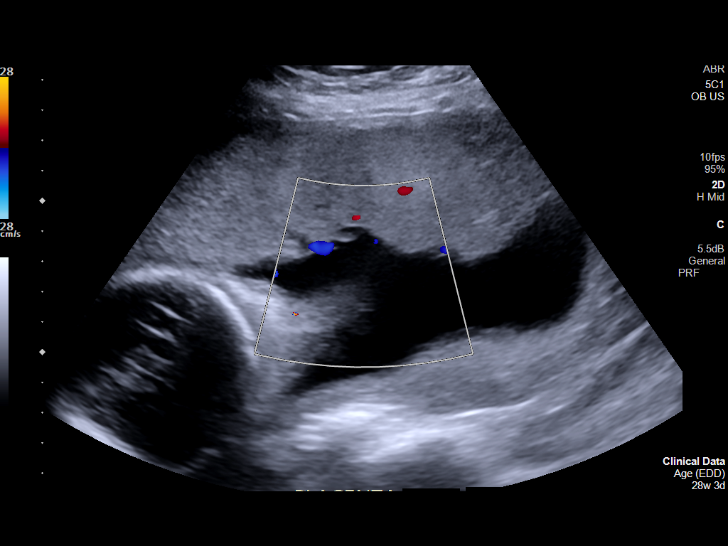
[im 14/54]
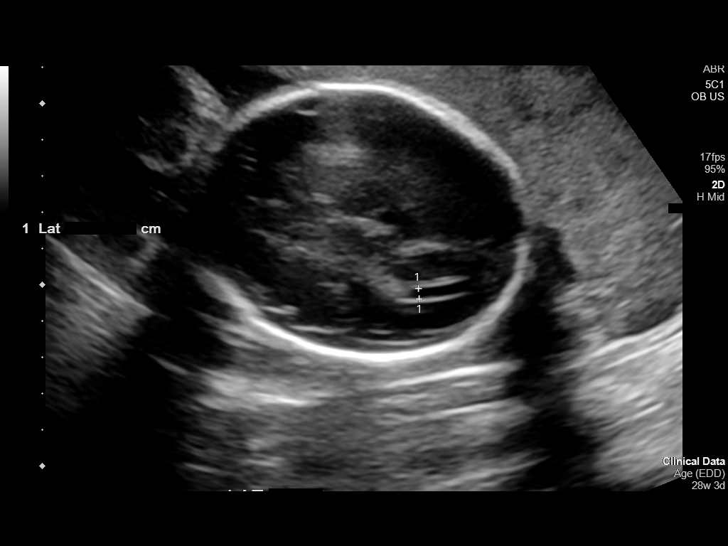
[im 18/54]
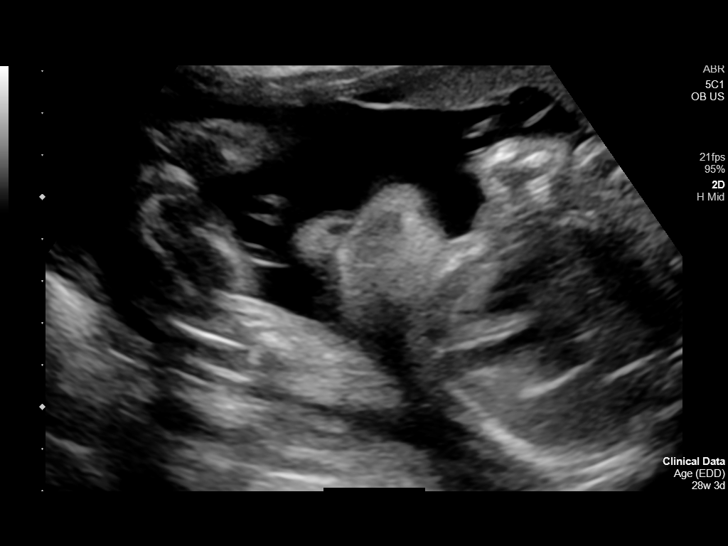
[im 22/54]
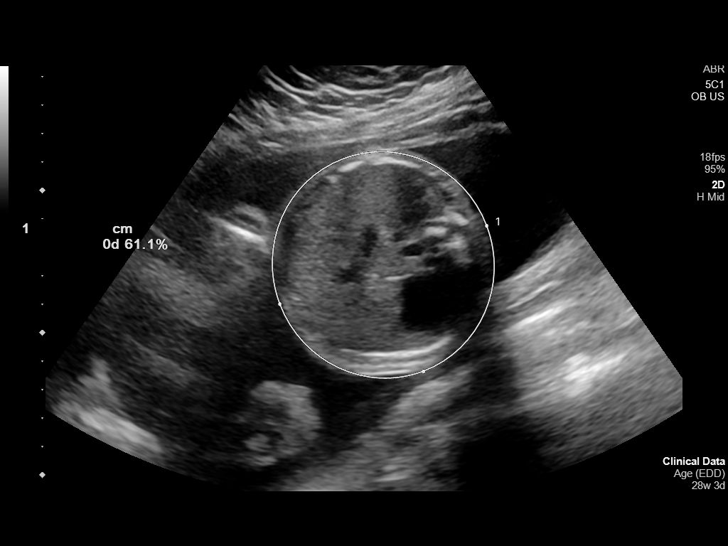
[im 28/54]
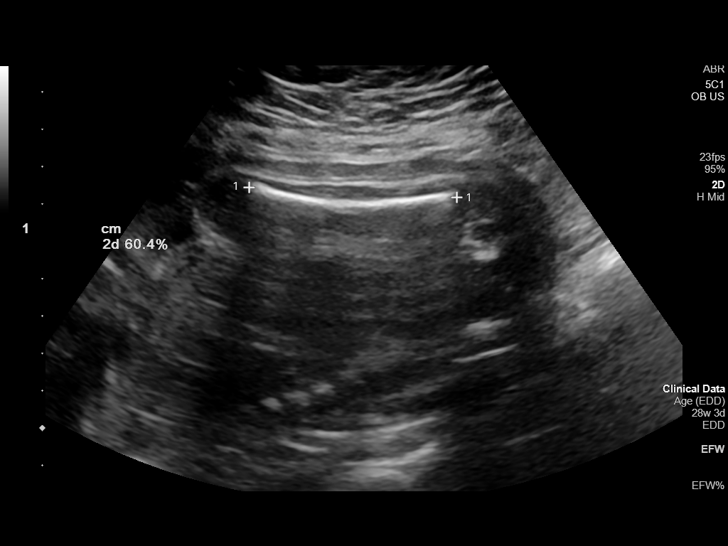
[im 32/54]
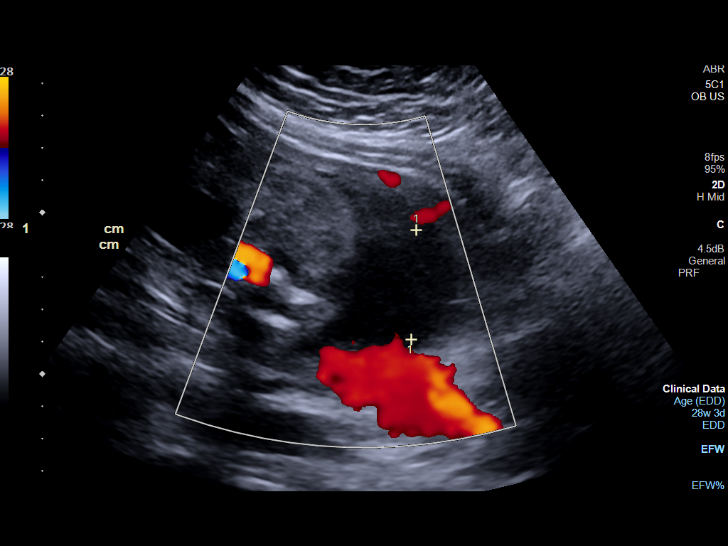
[im 36/54]
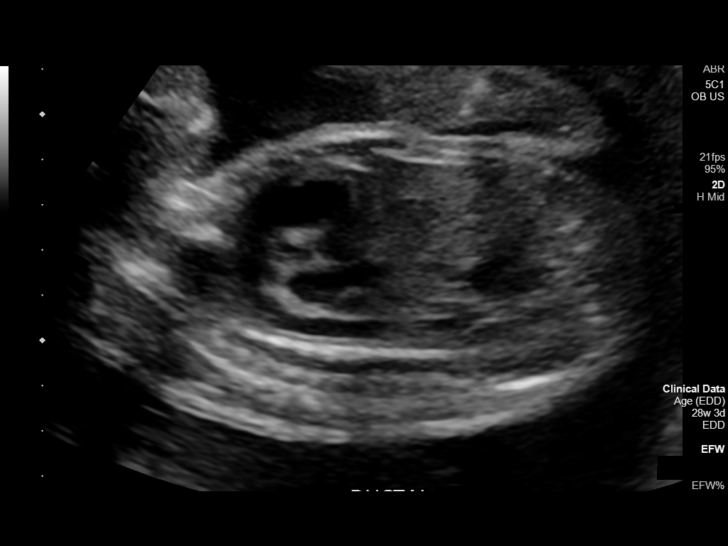
[im 40/54]
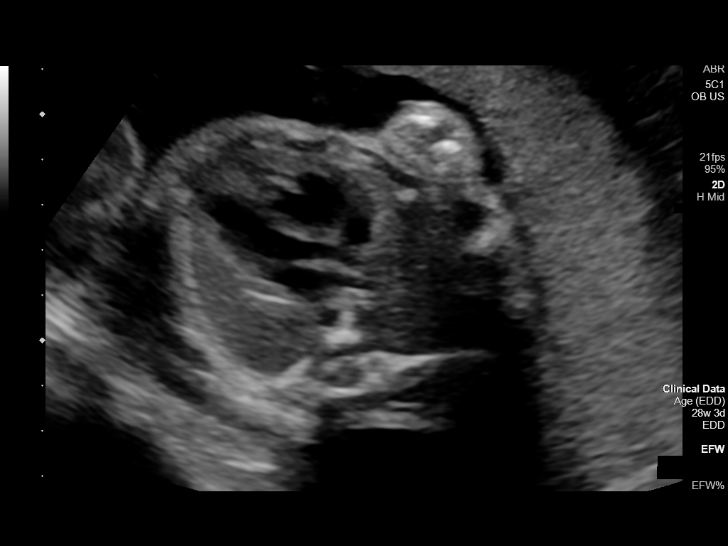
[im 44/54]
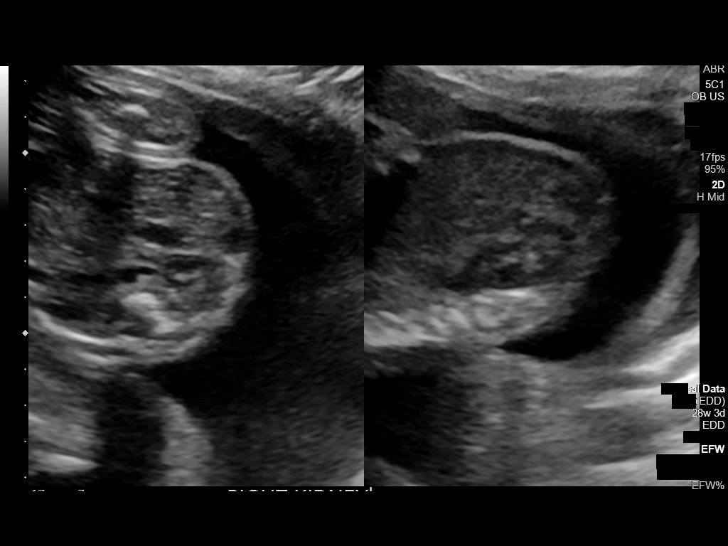
[im 48/54]
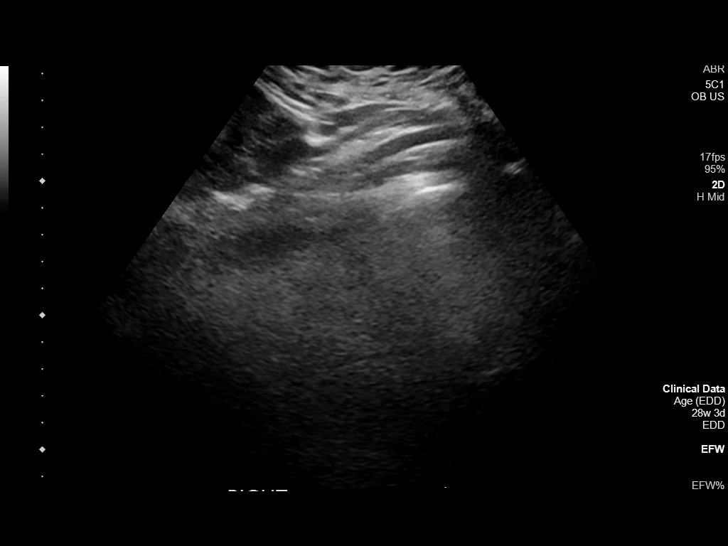
[im 52/54]
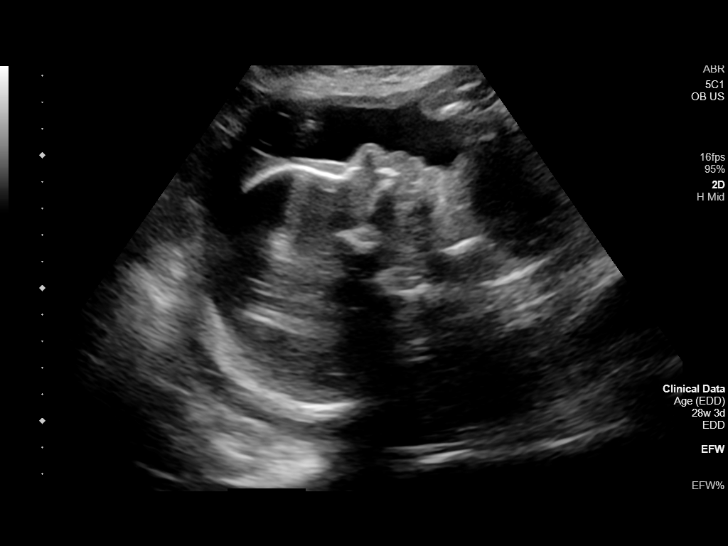

[13 of 28 positions shown; findings below may reference images not displayed]

FINDINGS: Number of Fetuses: 1

Heart Rate:  145 bpm

Movement: Yes

Presentation: Transverse

Previa: No

Placental Location: Anterior

Amniotic Fluid (Subjective): Normal

Amniotic Fluid (Objective):

AFI 13.1 cm (5%ile= 9.4 cm, 95%= 22.8 cm for 28 wks)

FETAL BIOMETRY

BPD:  7.3cm 29w 3d

HC:    27.2cm 29w 5d

AC:    24.9cm 29w 1d

FL:    5.5cm 29w 0d

Current Mean GA: 29w 2d US EDC: 01/12/2021

Assigned GA: 28w 3d Assigned EDC: 01/18/2021

Estimated Fetal Weight:  1,346g 65%ile

FETAL ANATOMY

Lateral Ventricles: Previously seen

Thalami/CSP: Previously seen

Posterior Fossa: Previously seen

Nuchal Region: Previously seen

Upper Lip: Previously seen

Spine: Previously seen

4 Chamber Heart on Left: Previously seen

LVOT: Appears normal

RVOT: Appears normal

Stomach on Left: Previously seen

3 Vessel Cord: Previously seen

Cord Insertion site: Previously seen

Kidneys: Previously seen

Bladder: Previously seen

Extremities: Previously seen

Sex: Previously Seen

Technical Limitations: Fetal positioning

Maternal Findings:

Cervix:  Closed.  4.6 cm.
IMPRESSION: 1. Single live intrauterine gestation in transverse presentation.
2. Assigned gestational age of 28 weeks 3 days. Adequate interval
growth.
3. Adequate visualization of the fetal cardiac ventricular outflow
tracts. Fetal anatomic survey is now complete. No fetal anomaly
detected.
4. Estimated fetal weight is in the 65th percentile.
5. Amniotic fluid index of 13.1 cm, within normal limits.
